# Patient Record
Sex: Female | Born: 1940 | Race: White | Hispanic: No | Marital: Single | Smoking: Never smoker
Health system: Southern US, Community
[De-identification: ages and names within clinical notes are randomized; demographics above are authoritative.]

## PROBLEM LIST (undated history)

## (undated) DIAGNOSIS — G309 Alzheimer's disease, unspecified: Secondary | ICD-10-CM

## (undated) DIAGNOSIS — F419 Anxiety disorder, unspecified: Secondary | ICD-10-CM

## (undated) DIAGNOSIS — F329 Major depressive disorder, single episode, unspecified: Secondary | ICD-10-CM

## (undated) DIAGNOSIS — F0280 Dementia in other diseases classified elsewhere without behavioral disturbance: Secondary | ICD-10-CM

## (undated) DIAGNOSIS — F32A Depression, unspecified: Secondary | ICD-10-CM

---

## 2016-02-14 ENCOUNTER — Encounter: Payer: Self-pay | Admitting: Sports Medicine

## 2016-02-14 ENCOUNTER — Ambulatory Visit (INDEPENDENT_AMBULATORY_CARE_PROVIDER_SITE_OTHER): Payer: Medicare Other | Admitting: Sports Medicine

## 2016-02-14 DIAGNOSIS — M79675 Pain in left toe(s): Secondary | ICD-10-CM

## 2016-02-14 DIAGNOSIS — I739 Peripheral vascular disease, unspecified: Secondary | ICD-10-CM

## 2016-02-14 DIAGNOSIS — M2040 Other hammer toe(s) (acquired), unspecified foot: Secondary | ICD-10-CM

## 2016-02-14 DIAGNOSIS — M79674 Pain in right toe(s): Secondary | ICD-10-CM | POA: Diagnosis not present

## 2016-02-14 DIAGNOSIS — B351 Tinea unguium: Secondary | ICD-10-CM | POA: Diagnosis not present

## 2016-02-14 DIAGNOSIS — M21619 Bunion of unspecified foot: Secondary | ICD-10-CM

## 2016-02-14 DIAGNOSIS — L84 Corns and callosities: Secondary | ICD-10-CM

## 2016-02-14 NOTE — Progress Notes (Signed)
Patient ID: Sarah GaskinsLinda Arroyo, female   DOB: Jul 28, 1941, 75 y.o.   MRN: [id number] Subjective: Sarah GaskinsLinda Arroyo is a 75 y.o. female patient seen today in office with complaint of painful callus and thickened and elongated toenails; unable to trim. Patient is assisted by facility caregiver who denies history of known Diabetes, Neuropathy, or Vascular disease. Patient has no other pedal complaints at this time.   There are no active problems to display for this patient.   No current outpatient prescriptions on file prior to visit.   No current facility-administered medications on file prior to visit.    No Known Allergies  Objective: Physical Exam  General: Well developed, nourished, no acute distress, awake, alert and oriented x 3  Vascular: Dorsalis pedis artery 1/4 bilateral, Posterior tibial artery 1/4 bilateral, skin temperature warm to warm proximal to distal bilateral lower extremities, + varicosities, no pedal hair present bilateral.  Neurological: Gross sensation present via light touch bilateral.   Dermatological: Skin is warm, dry, and supple bilateral, Nails 1-10 are tender, long, thick, and discolored with mild subungal debris, no webspace macerations present bilateral, no open lesions present bilateral, + hyperkeratotic tissue present right 2nd toe medial aspect and left 3rd toe distal tuft adjacent to nail. No signs of infection bilateral.  Musculoskeletal: Asymptomatic bunion and hammertoe boney deformities noted bilateral. Muscular strength within normal limits without pain on range of motion. No pain with calf compression bilateral.  Assessment and Plan:  Problem List Items Addressed This Visit    None    Visit Diagnoses    Dermatophytosis of nail    -  Primary    Callus of foot        Toe pain, bilateral        Bunion        Hammertoe, unspecified laterality        PVD (peripheral vascular disease) (HCC)          -Examined patient.  -Discussed treatment options for  painful callus and mycotic nails. -Mechanically debrided callus x 2 using sterile chisel blade and reduced mycotic nails with sterile nail nipper and dremel nail file without incident. -Recommend good supportive shoes and socks daily -Patient to return in 3 months for follow up evaluation or sooner if symptoms worsen.  Asencion Islamitorya Varshini Arrants, DPM

## 2016-05-07 ENCOUNTER — Emergency Department: Payer: Medicare Other

## 2016-05-07 ENCOUNTER — Encounter: Payer: Self-pay | Admitting: Emergency Medicine

## 2016-05-07 ENCOUNTER — Inpatient Hospital Stay
Admission: EM | Admit: 2016-05-07 | Discharge: 2016-05-11 | DRG: 481 | Disposition: A | Discharge status: Skilled Nursing Facility | Payer: Medicare Other | Attending: Internal Medicine | Admitting: Internal Medicine

## 2016-05-07 DIAGNOSIS — F0281 Dementia in other diseases classified elsewhere with behavioral disturbance: Secondary | ICD-10-CM | POA: Diagnosis present

## 2016-05-07 DIAGNOSIS — E876 Hypokalemia: Secondary | ICD-10-CM | POA: Diagnosis present

## 2016-05-07 DIAGNOSIS — E871 Hypo-osmolality and hyponatremia: Secondary | ICD-10-CM | POA: Diagnosis present

## 2016-05-07 DIAGNOSIS — Z66 Do not resuscitate: Secondary | ICD-10-CM | POA: Diagnosis present

## 2016-05-07 DIAGNOSIS — Z79899 Other long term (current) drug therapy: Secondary | ICD-10-CM | POA: Diagnosis not present

## 2016-05-07 DIAGNOSIS — I10 Essential (primary) hypertension: Secondary | ICD-10-CM | POA: Diagnosis present

## 2016-05-07 DIAGNOSIS — F419 Anxiety disorder, unspecified: Secondary | ICD-10-CM | POA: Diagnosis present

## 2016-05-07 DIAGNOSIS — S72001A Fracture of unspecified part of neck of right femur, initial encounter for closed fracture: Secondary | ICD-10-CM | POA: Diagnosis present

## 2016-05-07 DIAGNOSIS — S72009A Fracture of unspecified part of neck of unspecified femur, initial encounter for closed fracture: Secondary | ICD-10-CM

## 2016-05-07 DIAGNOSIS — W010XXA Fall on same level from slipping, tripping and stumbling without subsequent striking against object, initial encounter: Secondary | ICD-10-CM | POA: Diagnosis present

## 2016-05-07 DIAGNOSIS — F329 Major depressive disorder, single episode, unspecified: Secondary | ICD-10-CM | POA: Diagnosis present

## 2016-05-07 DIAGNOSIS — D72819 Decreased white blood cell count, unspecified: Secondary | ICD-10-CM | POA: Diagnosis present

## 2016-05-07 DIAGNOSIS — F05 Delirium due to known physiological condition: Secondary | ICD-10-CM | POA: Diagnosis not present

## 2016-05-07 DIAGNOSIS — Z419 Encounter for procedure for purposes other than remedying health state, unspecified: Secondary | ICD-10-CM

## 2016-05-07 DIAGNOSIS — M810 Age-related osteoporosis without current pathological fracture: Secondary | ICD-10-CM | POA: Diagnosis present

## 2016-05-07 DIAGNOSIS — G309 Alzheimer's disease, unspecified: Secondary | ICD-10-CM | POA: Diagnosis present

## 2016-05-07 HISTORY — DX: Major depressive disorder, single episode, unspecified: F32.9

## 2016-05-07 HISTORY — DX: Depression, unspecified: F32.A

## 2016-05-07 HISTORY — DX: Dementia in other diseases classified elsewhere, unspecified severity, without behavioral disturbance, psychotic disturbance, mood disturbance, and anxiety: F02.80

## 2016-05-07 HISTORY — DX: Alzheimer's disease, unspecified: G30.9

## 2016-05-07 HISTORY — DX: Anxiety disorder, unspecified: F41.9

## 2016-05-07 LAB — BASIC METABOLIC PANEL
Anion gap: 8 (ref 5–15)
BUN: 13 mg/dL (ref 6–20)
CALCIUM: 8.7 mg/dL — AB (ref 8.9–10.3)
CO2: 27 mmol/L (ref 22–32)
Chloride: 98 mmol/L — ABNORMAL LOW (ref 101–111)
Creatinine, Ser: 0.85 mg/dL (ref 0.44–1.00)
GFR calc Af Amer: >60 mL/min (ref >60)
Glucose, Bld: 105 mg/dL — ABNORMAL HIGH (ref 65–99)
POTASSIUM: 3.5 mmol/L (ref 3.5–5.1)
SODIUM: 133 mmol/L — AB (ref 135–145)

## 2016-05-07 LAB — CBC WITH DIFFERENTIAL/PLATELET
BASOS ABS: 0 10*3/uL (ref 0–0.1)
BASOS PCT: 1 %
EOS ABS: 0 10*3/uL (ref 0–0.7)
EOS PCT: 1 %
HCT: 40.8 % (ref 35.0–47.0)
Hemoglobin: 14.3 g/dL (ref 12.0–16.0)
LYMPHS PCT: 15 %
Lymphs Abs: 0.5 10*3/uL — ABNORMAL LOW (ref 1.0–3.6)
MCH: 30.2 pg (ref 26.0–34.0)
MCHC: 35.1 g/dL (ref 32.0–36.0)
MCV: 85.9 fL (ref 80.0–100.0)
Monocytes Absolute: 0.3 10*3/uL (ref 0.2–0.9)
Monocytes Relative: 10 %
Neutro Abs: 2.2 10*3/uL (ref 1.4–6.5)
Neutrophils Relative %: 73 %
PLATELETS: 183 10*3/uL (ref 150–440)
RBC: 4.75 MIL/uL (ref 3.80–5.20)
RDW: 14.6 % — AB (ref 11.5–14.5)
WBC: 3.1 10*3/uL — AB (ref 3.6–11.0)

## 2016-05-07 LAB — TYPE AND SCREEN
ABO/RH(D): A POS
ANTIBODY SCREEN: NEGATIVE

## 2016-05-07 LAB — PROTIME-INR
INR: 1.11
PROTHROMBIN TIME: 14.3 s (ref 11.4–15.2)

## 2016-05-07 LAB — ALBUMIN: Albumin: 3.7 g/dL (ref 3.5–5.0)

## 2016-05-07 LAB — CALCIUM: CALCIUM: 8.7 mg/dL — AB (ref 8.9–10.3)

## 2016-05-07 LAB — SURGICAL PCR SCREEN
MRSA, PCR: NEGATIVE
STAPHYLOCOCCUS AUREUS: POSITIVE — AB

## 2016-05-07 LAB — APTT: APTT: 30 s (ref 24–36)

## 2016-05-07 MED ORDER — MORPHINE SULFATE (PF) 2 MG/ML IV SOLN
2.0000 mg | INTRAVENOUS | Status: DC | PRN
  Administered 2016-05-08: 2 mg via INTRAVENOUS
  Filled 2016-05-07: qty 1

## 2016-05-07 MED ORDER — QUETIAPINE FUMARATE 25 MG PO TABS
50.0000 mg | ORAL_TABLET | Freq: Two times a day (BID) | ORAL | Status: DC
  Administered 2016-05-07 – 2016-05-11 (×7): 50 mg via ORAL
  Filled 2016-05-07 (×7): qty 2

## 2016-05-07 MED ORDER — DIVALPROEX SODIUM 250 MG PO DR TAB
250.0000 mg | DELAYED_RELEASE_TABLET | Freq: Every day | ORAL | Status: DC | PRN
  Administered 2016-05-08: 250 mg via ORAL
  Filled 2016-05-07: qty 1

## 2016-05-07 MED ORDER — GUAIFENESIN 100 MG/5ML PO SOLN
15.0000 mL | ORAL | Status: DC | PRN
  Filled 2016-05-07: qty 15

## 2016-05-07 MED ORDER — BISMUTH SUBSALICYLATE 262 MG/15ML PO SUSP
30.0000 mL | Freq: Three times a day (TID) | ORAL | Status: DC
  Administered 2016-05-07 – 2016-05-11 (×6): 30 mL via ORAL
  Filled 2016-05-07 (×2): qty 118

## 2016-05-07 MED ORDER — DIVALPROEX SODIUM 250 MG PO DR TAB
250.0000 mg | DELAYED_RELEASE_TABLET | Freq: Two times a day (BID) | ORAL | Status: DC
  Administered 2016-05-07 – 2016-05-09 (×4): 250 mg via ORAL
  Filled 2016-05-07 (×4): qty 1

## 2016-05-07 MED ORDER — ALUM & MAG HYDROXIDE-SIMETH 200-200-20 MG/5ML PO SUSP: 30.0000 mL | Freq: Four times a day (QID) | ORAL | Status: DC | PRN

## 2016-05-07 MED ORDER — ACETAMINOPHEN 325 MG PO TABS: 650.0000 mg | ORAL_TABLET | Freq: Four times a day (QID) | ORAL | Status: DC | PRN

## 2016-05-07 MED ORDER — SERTRALINE HCL 100 MG PO TABS
100.0000 mg | ORAL_TABLET | Freq: Every day | ORAL | Status: DC
  Administered 2016-05-09 – 2016-05-11 (×3): 100 mg via ORAL
  Filled 2016-05-07 (×3): qty 1

## 2016-05-07 MED ORDER — TRAZODONE HCL 50 MG PO TABS
50.0000 mg | ORAL_TABLET | Freq: Two times a day (BID) | ORAL | Status: DC
  Administered 2016-05-07 – 2016-05-11 (×7): 50 mg via ORAL
  Filled 2016-05-07 (×7): qty 1

## 2016-05-07 MED ORDER — BUSPIRONE HCL 5 MG PO TABS
7.5000 mg | ORAL_TABLET | Freq: Two times a day (BID) | ORAL | Status: DC
  Administered 2016-05-07 – 2016-05-11 (×7): 7.5 mg via ORAL
  Filled 2016-05-07: qty 1
  Filled 2016-05-07 (×2): qty 2
  Filled 2016-05-07: qty 1
  Filled 2016-05-07 (×5): qty 2

## 2016-05-07 MED ORDER — MORPHINE SULFATE (PF) 4 MG/ML IV SOLN
4.0000 mg | Freq: Once | INTRAVENOUS | Status: AC
  Administered 2016-05-07: 4 mg via INTRAVENOUS
  Filled 2016-05-07: qty 1

## 2016-05-07 MED ORDER — LORAZEPAM 2 MG/ML IJ SOLN
0.5000 mg | Freq: Once | INTRAMUSCULAR | Status: AC
  Administered 2016-05-07: 0.5 mg via INTRAVENOUS
  Filled 2016-05-07: qty 1

## 2016-05-07 MED ORDER — OXYCODONE HCL 5 MG PO TABS: 5.0000 mg | ORAL_TABLET | ORAL | Status: DC | PRN

## 2016-05-07 NOTE — ED Notes (Signed)
MD. Wieting at bedside  

## 2016-05-07 NOTE — ED Notes (Signed)
Changed pt and repositioned her

## 2016-05-07 NOTE — ED Triage Notes (Signed)
Pt presnets [facility] ED [facility] where pt had an unwitnessed fall at approx 1345. Pt c/o R hip pain. Per EMS +shortening and +Outward rotation. Pt grabs R hip when she is moved at this time. Pt has hx of dementia and repeatedly reaches for her IV. Per EMS pt had 50mcg of Fentanyl en route.

## 2016-05-07 NOTE — Consult Note (Signed)
ORTHOPAEDIC CONSULTATION  REQUESTING PHYSICIAN: Alford Highland, MD  Chief Complaint: Right hip pain status post fall  HPI: Sarah Arroyo is a 75 y.o. female with advanced dementia who fell today at her facility. She was using a walker and is reported to have been unsteady with her gait causing her to fall. Patient is unable to provide a history given her advanced dementia. Her brother, who is a retired Investment banker, operational with [employer], was at the bedside.    Past Medical History:  Diagnosis Date  . Alzheimer's dementia   . Anxiety   . Depression    History reviewed. No pertinent surgical history. Social History   Social History  . Marital status: Single    Spouse name: N/A  . Number of children: N/A  . Years of education: N/A   Social History Main Topics  . Smoking status: Unknown If Ever Smoked  . Smokeless tobacco: None  . Alcohol use No  . Drug use: No  . Sexual activity: Not Asked   Other Topics Concern  . None   Social History Narrative  . None   Family History  Problem Relation Age of Onset  . Family history unknown: Yes   No Known Allergies Prior to Admission medications   Medication Sig Start Date End Date Taking? Authorizing Provider  acetaminophen (TYLENOL) 325 MG tablet Take per standard prn orders (per [facility])   Yes Historical Provider, MD  alum & mag hydroxide-simeth (MAALOX/MYLANTA) 200-200-20 MG/5ML suspension Take per standard prn orders (per [facility])   Yes Historical Provider, MD  bismuth subsalicylate (PEPTO BISMOL) 262 MG/15ML suspension Take per standard prn orders (per [facility])   Yes Historical Provider, MD  busPIRone (BUSPAR) 7.5 MG tablet Take 7.5 mg by mouth 2 (two) times daily.  01/22/16  Yes Historical Provider, MD  divalproex (DEPAKOTE) 250 MG DR tablet Take 250 mg by mouth 2 (two) times daily.   Yes Historical Provider, MD  divalproex (DEPAKOTE) 250 MG DR tablet Take 250 mg by mouth daily as needed.   Yes Historical Provider, MD   guaiFENesin (ROBITUSSIN) 100 MG/5ML SOLN Take by mouth. Take per standard prn orders (per [facility])   Yes Historical Provider, MD  loperamide (IMODIUM) 2 MG capsule Take per standard prn orders (per [facility])   Yes Historical Provider, MD  magnesium hydroxide (MILK OF MAGNESIA) 400 MG/5ML suspension Take per standard prn orders (per [facility])   Yes Historical Provider, MD  Neomycin-Bacitracin-Polymyxin (TRIPLE ANTIBIOTIC) [phone] OINT Apply per standard prn orders (per [facility])   Yes Historical Provider, MD  QUEtiapine (SEROQUEL) 50 MG tablet Take 50 mg by mouth 2 (two) times daily.  01/22/16  Yes Historical Provider, MD  sertraline (ZOLOFT) 100 MG tablet Take 100 mg by mouth daily.  01/24/16  Yes Historical Provider, MD  traZODone (DESYREL) 50 MG tablet Take 50 mg by mouth 2 (two) times daily.  01/22/16  Yes Historical Provider, MD   Dg Chest Port 1 View  Result Date: 05/07/2016 CLINICAL DATA:  Larey Seat this afternoon while turning around, landed on RIGHT side on floor, RIGHT hip pain down length of RIGHT leg, preoperative evaluation, history Alzheimer's EXAM: PORTABLE CHEST 1 VIEW COMPARISON:  Portable exam 1510 hours without priors for comparison FINDINGS: Normal heart size, mediastinal contours, and pulmonary vascularity. Lungs hyperinflated but clear. No pleural effusion or pneumothorax. Diffuse osseous demineralization. IMPRESSION: Hyperinflated lungs without acute abnormalities. Electronically Signed   By: Ulyses Southward M.D.   On: 05/07/2016 15:28   Dg Hip Unilat W Or  Wo Pelvis 2-3 Views Right  Result Date: 05/07/2016 CLINICAL DATA:  Fall. EXAM: DG HIP (WITH OR WITHOUT PELVIS) 2-3V RIGHT COMPARISON:  No recent prior. FINDINGS: Prior lower lumbar fusion. Degenerative changes lumbar spine and both hips. Diffuse osteopenia. Slightly displaced right femoral neck fracture. No evidence of dislocation. IMPRESSION: Slightly displaced right femoral neck fracture. Electronically Signed   By: Maisie Fushomas  Register   On: 05/07/2016  15:28    Positive ROS: All other systems have been reviewed and were otherwise negative with the exception of those mentioned in the HPI and as above.  Physical Exam: General: Awake, no acute distress. Ptient is confused and unable to follow commands or participate in physical exam.  MUSCULOSKELETAL: Right lower extremity:  Skin intact overlying the right hip.  No erythema, ecchymosis or significant swelling. Her thigh compartments are soft and compressible. The right lower extremity demonstrates minimal external rotation and no significant shortening. She has palpable pedal pulses. Patient has spontaneous flexion extension of her toes in dorsiflexion and plantarflexion of her ankle. Sensation cannot be determined based on the patient's dementia.  Assessment: Minimally displaced right femoral neck hip fracture  Plan: I discussed the patient's fracture with her brother, Dr. Britt BologneseEd Goldman, retired orthopedic surgeon. I showed him the x-ray films today. The fracture is minimally displaced and I feel would be amenable to percutaneous screw fixation. Dr. Fraser DinPreston agreed. He is aware of the alternative which would be a right hip hemiarthroplasty.  He left the decision up to me and was in agreement with the plan for fixation.   Dr. Fraser DinPreston is aware of the risks and benefits of this operation attending performed himself during his career. He understands that there is a risk for infection, bleeding, nerve or blood vessel injury, avascular necrosis, displacement of the fracture, failure of the hardware, malunion, nonunion, persistent right hip pain and the need for further surgery including conversion to a hemi-arthroplasty. He understands the medical risks include DVT and pulmonary wasn't macro infarction, stroke, pneumonia, respiratory failure and death.  Patient has been cleared by the hospitalist service for surgery. They feel that she is at moderately high risk for surgery based on her age and comorbidities.  I reviewed the patient's laboratories and radiographic studies in preparation for this case. Consent has been signed with the patient's brother, who is her power of attorney. The patient scheduled for surgery tomorrow morning.      Sarah Arroyo, Sarah Clute, MD    05/07/2016 6:32 PM

## 2016-05-07 NOTE — ED Provider Notes (Addendum)
[facility] Emergency Department Provider Note  Level V caveat: Review of systems and history is limited by dementia      Time seen: ----------------------------------------- 3:10 PM on 05/07/2016 -----------------------------------------    I have reviewed the triage vital signs and the nursing notes.   HISTORY  Chief Complaint Hip Pain    HPI Sarah Arroyo is a 75 y.o. female who presents to the ER brought in by EMS from a family care home where she had an unwitnessed fall approximately 145. Patient is demented and forgets to use her walker. She presents being unable to bear weight particularly on her right leg. She is complaining of right hip pain. EMS reports shortening and outward rotation of the right leg. Family care home worker states she has not been sick recently, no recent changes in her medicines.   Past Medical History:  Diagnosis Date  . Alzheimer's dementia   . Anxiety   . Depression     There are no active problems to display for this patient.   No past surgical history on file.  Allergies Review of patient's allergies indicates no known allergies.  Social History Social History  Substance Use Topics  . Smoking status: Unknown If Ever Smoked  . Smokeless tobacco: Not on file  . Alcohol use No    Review of Systems Unknown, review of systems limited by dementia  ____________________________________________   PHYSICAL EXAM:  VITAL SIGNS: ED Triage Vitals  Enc Vitals Group     BP 05/07/16 1448 (!) 158/91     Pulse Rate 05/07/16 1448 72     Resp --      Temp 05/07/16 1448 97.9 F (36.6 C)     Temp Source 05/07/16 1448 Oral     SpO2 05/07/16 1448 96 %     Weight 05/07/16 1437 130 lb (59 kg)     Height 05/07/16 1437 5\' 5"  (1.651 m)     Head Circumference --      Peak Flow --      Pain Score --      Pain Loc --      Pain Edu? --      Excl. in GC? --     Constitutional: Alert, Well appearing and in no  distress. Eyes: Conjunctivae are normal. PERRL. Normal extraocular movements. ENT   Head: Normocephalic and atraumatic.   Nose: No congestion/rhinnorhea.   Mouth/Throat: Mucous membranes are moist.   Neck: No stridor. Cardiovascular: Normal rate, regular rhythm. No murmurs, rubs, or gallops.Good distal pulses. Respiratory: Normal respiratory effort without tachypnea nor retractions. Breath sounds are clear and equal bilaterally. No wheezes/rales/rhonchi. Gastrointestinal: Soft and nontender. Normal bowel sounds Musculoskeletal: Right lower extremity pain with range of motion, externally rotated and shortened. Neurologic:  Normal speech and language. No gross focal neurologic deficits are appreciated.  Skin:  Skin is warm, dry and intact. No rash noted. Psychiatric: Mood and affect are normal. Speech and behavior are normal.   EKG: Interpreted by me, sinus rhythm with rate of 75 bpm, baseline artifact from tremor, normal axis, no evidence of acute infarction  ____________________________________________  ED COURSE:  Pertinent labs & imaging results that were available during my care of the patient were reviewed by me and considered in my medical decision making (see chart for details). Clinical Course  She presents ER mechanical fall, likely hip fracture. We will assess with a sick x-rays and labs.  Procedures ____________________________________________   LABS (pertinent positives/negatives)  Labs Reviewed  BASIC  METABOLIC PANEL - Abnormal; Notable for the following:       Result Value   Sodium 133 (*)    Chloride 98 (*)    Glucose, Bld 105 (*)    Calcium 8.7 (*)    All other components within normal limits  CBC WITH DIFFERENTIAL/PLATELET - Abnormal; Notable for the following:    WBC 3.1 (*)    RDW 14.6 (*)    Lymphs Abs 0.5 (*)    All other components within normal limits  URINALYSIS COMPLETEWITH MICROSCOPIC ([facility] ONLY)    RADIOLOGY Images were viewed by  me  Chest x-ray, right hip x-ray Chest x-ray is normal, right hip x-ray reveals right femoral neck fracture ____________________________________________  FINAL ASSESSMENT AND PLAN  Right femoral neck fracture, dementia  Plan: Patient with labs and imaging as dictated above. Patient presented after mechanical fall and revealed have right femoral neck fracture. I discussed with orthopedics on-call Dr. Real ConsKrazinski who is made aware and will consult on the patient while in the hospital. Patient will be admitted by the hospitalist service.   Emily FilbertWilliams, Mikie Misner E, MD   Note: This dictation was prepared with Dragon dictation. Any transcriptional errors that result from this process are unintentional    Emily FilbertJonathan E Tyheem Boughner, MD 05/07/16 1535    Emily FilbertJonathan E Kelvin Burpee, MD 05/07/16 1556

## 2016-05-07 NOTE — ED Notes (Signed)
Staff member stated that they needed to leave.  Pts door left open w/ clear visualization of pt from nursing station

## 2016-05-07 NOTE — H&P (Signed)
[facility] [place] [facility]   PATIENT NAME: Sarah Arroyo    MR#:  [id number]  DATE OF BIRTH:  01/02/1941  DATE OF ADMISSION:  05/07/2016  PRIMARY CARE PHYSICIAN: Mortimer Fries, PA   REQUESTING/REFERRING PHYSICIAN: Dr Presley Raddle  CHIEF COMPLAINT:   Chief Complaint  Patient presents with  . Hip Pain    HISTORY OF PRESENT ILLNESS:  Sarah Arroyo  is a 75 y.o. female with a known history Of dementia. Patient is a poor historian and did have some agitation when I was talking to her but did answer some yes or no questions. Unable to tell me what happened. Patient does complain of right hip pain. Patient unable to tell me any information about her medical problems.  PAST MEDICAL HISTORY:   Past Medical History:  Diagnosis Date  . Alzheimer's dementia   . Anxiety   . Depression     PAST SURGICAL HISTORY:  History reviewed. No pertinent surgical history.  SOCIAL HISTORY:   Social History  Substance Use Topics  . Smoking status: Unknown If Ever Smoked  . Smokeless tobacco: Not on file  . Alcohol use No    FAMILY HISTORY:   Family History  Problem Relation Age of Onset  . Family history unknown: Yes    DRUG ALLERGIES:  No Known Allergies  REVIEW OF SYSTEMS:  Very limited secondary to dementia  MEDICATIONS AT HOME:   Prior to Admission medications   Medication Sig Start Date End Date Taking? Authorizing Provider  acetaminophen (TYLENOL) 325 MG tablet Take per standard prn orders (per [facility])   Yes Historical Provider, MD  alum & mag hydroxide-simeth (MAALOX/MYLANTA) 200-200-20 MG/5ML suspension Take per standard prn orders (per [facility])   Yes Historical Provider, MD  bismuth subsalicylate (PEPTO BISMOL) 262 MG/15ML suspension Take per standard prn orders (per [facility])   Yes Historical Provider, MD  busPIRone (BUSPAR) 7.5 MG tablet Take 7.5 mg by mouth 2 (two) times daily.  01/22/16  Yes Historical Provider, MD  divalproex (DEPAKOTE) 250  MG DR tablet Take 250 mg by mouth 2 (two) times daily.   Yes Historical Provider, MD  divalproex (DEPAKOTE) 250 MG DR tablet Take 250 mg by mouth daily as needed.   Yes Historical Provider, MD  guaiFENesin (ROBITUSSIN) 100 MG/5ML SOLN Take by mouth. Take per standard prn orders (per [facility])   Yes Historical Provider, MD  loperamide (IMODIUM) 2 MG capsule Take per standard prn orders (per [facility]   Yes Historical Provider, MD  magnesium hydroxide (MILK OF MAGNESIA) 400 MG/5ML suspension Take per standard prn orders (per [facility])   Yes Historical Provider, MD  Neomycin-Bacitracin-Polymyxin (TRIPLE ANTIBIOTIC) [phone] OINT Apply per standard prn orders (per [facility])   Yes Historical Provider, MD  QUEtiapine (SEROQUEL) 50 MG tablet Take 50 mg by mouth 2 (two) times daily.  01/22/16  Yes Historical Provider, MD  sertraline (ZOLOFT) 100 MG tablet Take 100 mg by mouth daily.  01/24/16  Yes Historical Provider, MD  traZODone (DESYREL) 50 MG tablet Take 50 mg by mouth 2 (two) times daily.  01/22/16  Yes Historical Provider, MD      VITAL SIGNS:  Blood pressure (!) 158/91, pulse 72, temperature 97.9 F (36.6 C), temperature source Oral, height 5\' 5"  (1.651 m), weight 59 kg (130 lb), SpO2 96 %.  PHYSICAL EXAMINATION:  GENERAL:  75 y.o.-year-old patient lying in the bed with Some agitation EYES: Pupils equal, round, reactive to light and accommodation. No scleral icterus. Extraocular muscles intact.  HEENT: Head atraumatic, normocephalic. Oropharynx and nasopharynx clear.  NECK:  Supple, no jugular venous distention. No thyroid enlargement, no tenderness.  LUNGS: Normal breath sounds bilaterally, no wheezing, rales,rhonchi or crepitation. No use of accessory muscles of respiration.  CARDIOVASCULAR: S1, S2 normal. No murmurs, rubs, or gallops.  ABDOMEN: Soft, nontender, nondistended. Bowel sounds present. No organomegaly or mass.  EXTREMITIES: No pedal edema, cyanosis, or clubbing. Right leg shortened and externally  rotated NEUROLOGIC: Cranial nerves II through XII are intact.  PSYCHIATRIC: The patient is alert and tries to answer questions.Marland Kitchen.  SKIN: No rash, lesion, or ulcer.   LABORATORY PANEL:   CBC  Recent Labs Lab 05/07/16 1440  WBC 3.1*  HGB 14.3  HCT 40.8  PLT 183   ------------------------------------------------------------------------------------------------------------------  Chemistries   Recent Labs Lab 05/07/16 1440  NA 133*  K 3.5  CL 98*  CO2 27  GLUCOSE 105*  BUN 13  CREATININE 0.85  CALCIUM 8.7*   ------------------------------------------------------------------------------------------------------------------    RADIOLOGY:  Dg Chest Port 1 View  Result Date: 05/07/2016 CLINICAL DATA:  Larey SeatFell this afternoon while turning around, landed on RIGHT side on floor, RIGHT hip pain down length of RIGHT leg, preoperative evaluation, history Alzheimer's EXAM: PORTABLE CHEST 1 VIEW COMPARISON:  Portable exam 1510 hours without priors for comparison FINDINGS: Normal heart size, mediastinal contours, and pulmonary vascularity. Lungs hyperinflated but clear. No pleural effusion or pneumothorax. Diffuse osseous demineralization. IMPRESSION: Hyperinflated lungs without acute abnormalities. Electronically Signed   By: Ulyses SouthwardMark  Boles M.D.   On: 05/07/2016 15:28   Dg Hip Unilat W Or Wo Pelvis 2-3 Views Right  Result Date: 05/07/2016 CLINICAL DATA:  Fall. EXAM: DG HIP (WITH OR WITHOUT PELVIS) 2-3V RIGHT COMPARISON:  No recent prior. FINDINGS: Prior lower lumbar fusion. Degenerative changes lumbar spine and both hips. Diffuse osteopenia. Slightly displaced right femoral neck fracture. No evidence of dislocation. IMPRESSION: Slightly displaced right femoral neck fracture. Electronically Signed   By: Maisie Fushomas  Register   On: 05/07/2016 15:28    EKG:   Normal sinus rhythm 75 bpm, right atrial enlargement, right axis deviation  IMPRESSION AND PLAN:   1. Preop consultation for right femoral  neck fracture. Patient is a moderate risk for surgery with age and dementia. No contraindications to surgery at this time. In order to walk again and to lessen pain I do advise surgery. Mortality within the first year of hip fracture is very high especially if the patient does not get up and walk again. High risk for postoperative delirium. Consent for surgery can be obtained by calling the patient's brother Britt Bolognesed Uresti at [phone]. Pain control with IV and oral medications 2. Dementia with agitation continue all the patient's psychiatric medications including Seroquel and Depakote. Patient will need a bed close to the nursing station 3. Hypokalemia replace potassium 4. Leukopenia no previous labs to compare. 5. Hyponatremia. Sodium 2 points less than normal range. Continue to monitor.   All the records are reviewed and case discussed with ED provider. Management plans discussed with the patient, family and they are in agreement.  CODE STATUS: DO NOT RESUSCITATE status confirmed with brother  TOTAL TIME TAKING CARE OF THIS PATIENT: 45 minutes.    Alford HighlandWIETING, Shaneca Orne M.D on 05/07/2016 at 4:51 PM  Between 7am to 6pm - Pager - [phone]  After 6pm call admission pager [phone]  [facility]  [phone]  CC: Primary care physician; Mortimer Friesavid Curl, PA

## 2016-05-08 ENCOUNTER — Encounter
Admission: EM | Disposition: A | Discharge status: Skilled Nursing Facility | Payer: Self-pay | Source: Home / Self Care | Attending: Internal Medicine

## 2016-05-08 ENCOUNTER — Inpatient Hospital Stay: Payer: Medicare Other

## 2016-05-08 ENCOUNTER — Encounter: Payer: Self-pay | Admitting: Anesthesiology

## 2016-05-08 ENCOUNTER — Inpatient Hospital Stay: Payer: Medicare Other | Admitting: Anesthesiology

## 2016-05-08 HISTORY — PX: HIP PINNING,CANNULATED: SHX1758

## 2016-05-08 LAB — CBC
HCT: 37.1 % (ref 35.0–47.0)
Hemoglobin: 13.2 g/dL (ref 12.0–16.0)
MCH: 30 pg (ref 26.0–34.0)
MCHC: 35.5 g/dL (ref 32.0–36.0)
MCV: 84.3 fL (ref 80.0–100.0)
Platelets: 161 10*3/uL (ref 150–440)
RBC: 4.4 MIL/uL (ref 3.80–5.20)
RDW: 14.2 % (ref 11.5–14.5)
WBC: 8.1 10*3/uL (ref 3.6–11.0)

## 2016-05-08 LAB — BASIC METABOLIC PANEL
Anion gap: 6 (ref 5–15)
BUN: 15 mg/dL (ref 6–20)
CALCIUM: 8.2 mg/dL — AB (ref 8.9–10.3)
CO2: 27 mmol/L (ref 22–32)
CREATININE: 0.73 mg/dL (ref 0.44–1.00)
Chloride: 100 mmol/L — ABNORMAL LOW (ref 101–111)
Glucose, Bld: 122 mg/dL — ABNORMAL HIGH (ref 65–99)
Potassium: 3.5 mmol/L (ref 3.5–5.1)
SODIUM: 133 mmol/L — AB (ref 135–145)

## 2016-05-08 SURGERY — FIXATION, FEMUR, NECK, PERCUTANEOUS, USING SCREW
Anesthesia: General | Site: Hip | Laterality: Right | Wound class: Clean

## 2016-05-08 MED ORDER — CEFAZOLIN SODIUM 1 G IJ SOLR
INTRAMUSCULAR | Status: DC | PRN
  Administered 2016-05-08: 1 g via INTRAMUSCULAR

## 2016-05-08 MED ORDER — OXYCODONE HCL 5 MG PO TABS: 5.0000 mg | ORAL_TABLET | Freq: Once | ORAL | Status: DC | PRN

## 2016-05-08 MED ORDER — KETAMINE HCL 50 MG/ML IJ SOLN
INTRAMUSCULAR | Status: DC | PRN
  Administered 2016-05-08 (×2): 25 mg via INTRAMUSCULAR

## 2016-05-08 MED ORDER — OXYCODONE HCL 5 MG/5ML PO SOLN: 5.0000 mg | Freq: Once | ORAL | Status: DC | PRN

## 2016-05-08 MED ORDER — POLYETHYLENE GLYCOL 3350 17 G PO PACK: 17.0000 g | PACK | Freq: Every day | ORAL | Status: DC | PRN

## 2016-05-08 MED ORDER — ONDANSETRON HCL 4 MG/2ML IJ SOLN: 4.0000 mg | Freq: Four times a day (QID) | INTRAMUSCULAR | Status: DC | PRN

## 2016-05-08 MED ORDER — ONDANSETRON HCL 4 MG/2ML IJ SOLN
INTRAMUSCULAR | Status: DC | PRN
  Administered 2016-05-08: 4 mg via INTRAVENOUS

## 2016-05-08 MED ORDER — MORPHINE SULFATE (PF) 2 MG/ML IV SOLN
2.0000 mg | INTRAVENOUS | Status: DC | PRN
  Administered 2016-05-08 – 2016-05-09 (×3): 2 mg via INTRAVENOUS
  Filled 2016-05-08 (×3): qty 1

## 2016-05-08 MED ORDER — MIDAZOLAM HCL 2 MG/2ML IJ SOLN
INTRAMUSCULAR | Status: DC | PRN
  Administered 2016-05-08 (×2): 1 mg via INTRAVENOUS

## 2016-05-08 MED ORDER — FENTANYL CITRATE (PF) 100 MCG/2ML IJ SOLN
INTRAMUSCULAR | Status: DC | PRN
  Administered 2016-05-08: 50 ug via INTRAVENOUS
  Administered 2016-05-08 (×2): 25 ug via INTRAVENOUS

## 2016-05-08 MED ORDER — MENTHOL 3 MG MT LOZG
1.0000 | LOZENGE | OROMUCOSAL | Status: DC | PRN
  Filled 2016-05-08: qty 9

## 2016-05-08 MED ORDER — BISACODYL 10 MG RE SUPP
10.0000 mg | Freq: Every day | RECTAL | Status: DC | PRN
  Administered 2016-05-11: 10 mg via RECTAL
  Filled 2016-05-08 (×2): qty 1

## 2016-05-08 MED ORDER — FENTANYL CITRATE (PF) 100 MCG/2ML IJ SOLN
INTRAMUSCULAR | Status: AC
  Filled 2016-05-08: qty 2

## 2016-05-08 MED ORDER — SODIUM CHLORIDE 0.9 % IV SOLN
75.0000 mL/h | INTRAVENOUS | Status: DC
  Administered 2016-05-08 – 2016-05-09 (×3): 75 mL/h via INTRAVENOUS

## 2016-05-08 MED ORDER — LIDOCAINE HCL (CARDIAC) 20 MG/ML IV SOLN
INTRAVENOUS | Status: DC | PRN
  Administered 2016-05-08: 80 mg via INTRAVENOUS

## 2016-05-08 MED ORDER — MAGNESIUM CITRATE PO SOLN
1.0000 | Freq: Once | ORAL | Status: DC | PRN
  Filled 2016-05-08: qty 296

## 2016-05-08 MED ORDER — ENOXAPARIN SODIUM 30 MG/0.3ML ~~LOC~~ SOLN: 30.0000 mg | SUBCUTANEOUS | Status: DC

## 2016-05-08 MED ORDER — DEXAMETHASONE SODIUM PHOSPHATE 10 MG/ML IJ SOLN
INTRAMUSCULAR | Status: DC | PRN
  Administered 2016-05-08: 10 mg via INTRAVENOUS

## 2016-05-08 MED ORDER — ALUM & MAG HYDROXIDE-SIMETH 200-200-20 MG/5ML PO SUSP: 30.0000 mL | ORAL | Status: DC | PRN

## 2016-05-08 MED ORDER — SUGAMMADEX SODIUM 200 MG/2ML IV SOLN
INTRAVENOUS | Status: DC | PRN
  Administered 2016-05-08: 118 mg via INTRAVENOUS

## 2016-05-08 MED ORDER — NEOMYCIN-POLYMYXIN B GU 40-200000 IR SOLN
Status: DC | PRN
  Administered 2016-05-08: 4 mL

## 2016-05-08 MED ORDER — ACETAMINOPHEN 650 MG RE SUPP: 650.0000 mg | Freq: Four times a day (QID) | RECTAL | Status: DC | PRN

## 2016-05-08 MED ORDER — ROCURONIUM BROMIDE 100 MG/10ML IV SOLN
INTRAVENOUS | Status: DC | PRN
  Administered 2016-05-08: 5 mg via INTRAVENOUS
  Administered 2016-05-08: 35 mg via INTRAVENOUS

## 2016-05-08 MED ORDER — BUPIVACAINE HCL (PF) 0.25 % IJ SOLN
INTRAMUSCULAR | Status: AC
  Filled 2016-05-08: qty 30

## 2016-05-08 MED ORDER — FENTANYL CITRATE (PF) 100 MCG/2ML IJ SOLN
25.0000 ug | INTRAMUSCULAR | Status: DC | PRN
  Administered 2016-05-08: 25 ug via INTRAVENOUS
  Administered 2016-05-08: 50 ug via INTRAVENOUS

## 2016-05-08 MED ORDER — ACETAMINOPHEN 325 MG PO TABS
650.0000 mg | ORAL_TABLET | Freq: Four times a day (QID) | ORAL | Status: DC | PRN
  Administered 2016-05-09: 650 mg via ORAL
  Filled 2016-05-08: qty 2

## 2016-05-08 MED ORDER — KETOROLAC TROMETHAMINE 30 MG/ML IJ SOLN
INTRAMUSCULAR | Status: DC | PRN
  Administered 2016-05-08: 30 mg via INTRAVENOUS

## 2016-05-08 MED ORDER — ENOXAPARIN SODIUM 40 MG/0.4ML ~~LOC~~ SOLN
40.0000 mg | SUBCUTANEOUS | Status: DC
  Administered 2016-05-09 – 2016-05-11 (×3): 40 mg via SUBCUTANEOUS
  Filled 2016-05-08 (×3): qty 0.4

## 2016-05-08 MED ORDER — LORAZEPAM 2 MG/ML IJ SOLN
1.0000 mg | INTRAMUSCULAR | Status: AC
  Administered 2016-05-08: 1 mg via INTRAVENOUS
  Filled 2016-05-08: qty 1

## 2016-05-08 MED ORDER — PHENOL 1.4 % MT LIQD
1.0000 | OROMUCOSAL | Status: DC | PRN
  Filled 2016-05-08: qty 177

## 2016-05-08 MED ORDER — SUCCINYLCHOLINE CHLORIDE 20 MG/ML IJ SOLN
INTRAMUSCULAR | Status: DC | PRN
  Administered 2016-05-08: 100 mg via INTRAVENOUS

## 2016-05-08 MED ORDER — PROPOFOL 10 MG/ML IV BOLUS
INTRAVENOUS | Status: DC | PRN
  Administered 2016-05-08: 100 mg via INTRAVENOUS

## 2016-05-08 MED ORDER — PHENYLEPHRINE HCL 10 MG/ML IJ SOLN
INTRAMUSCULAR | Status: DC | PRN
  Administered 2016-05-08: 100 ug via INTRAVENOUS
  Administered 2016-05-08: 50 ug via INTRAVENOUS

## 2016-05-08 MED ORDER — HYDROCODONE-ACETAMINOPHEN 5-325 MG PO TABS
1.0000 | ORAL_TABLET | Freq: Four times a day (QID) | ORAL | Status: DC | PRN
  Administered 2016-05-08: 2 via ORAL
  Administered 2016-05-10: 1 via ORAL
  Filled 2016-05-08: qty 2
  Filled 2016-05-08: qty 1

## 2016-05-08 MED ORDER — BUPIVACAINE HCL 0.25 % IJ SOLN
INTRAMUSCULAR | Status: DC | PRN
  Administered 2016-05-08: 30 mL

## 2016-05-08 MED ORDER — DOCUSATE SODIUM 100 MG PO CAPS
100.0000 mg | ORAL_CAPSULE | Freq: Two times a day (BID) | ORAL | Status: DC
  Administered 2016-05-08 – 2016-05-11 (×6): 100 mg via ORAL
  Filled 2016-05-08 (×6): qty 1

## 2016-05-08 MED ORDER — CEFAZOLIN IN D5W 1 GM/50ML IV SOLN
1.0000 g | Freq: Four times a day (QID) | INTRAVENOUS | Status: AC
  Administered 2016-05-08 (×2): 1 g via INTRAVENOUS
  Filled 2016-05-08 (×2): qty 50

## 2016-05-08 MED ORDER — LACTATED RINGERS IV SOLN
INTRAVENOUS | Status: DC | PRN
  Administered 2016-05-08 (×2): via INTRAVENOUS

## 2016-05-08 MED ORDER — ONDANSETRON HCL 4 MG PO TABS: 4.0000 mg | ORAL_TABLET | Freq: Four times a day (QID) | ORAL | Status: DC | PRN

## 2016-05-08 SURGICAL SUPPLY — 33 items
BIT DRILL CANN LRG QC 5X300 (BIT) ×3 IMPLANT
BLADE SURG SZ11 CARB STEEL (BLADE) ×3 IMPLANT
BNDG COHESIVE 6X5 TAN STRL LF (GAUZE/BANDAGES/DRESSINGS) ×3 IMPLANT
CANISTER SUCT 1200ML W/VALVE (MISCELLANEOUS) ×3 IMPLANT
CATH TRAY 16F METER LATEX (MISCELLANEOUS) ×6 IMPLANT
DRAPE SURG 17X11 SM STRL (DRAPES) ×6 IMPLANT
DRAPE U-SHAPE 47X51 STRL (DRAPES) ×3 IMPLANT
DRSG OPSITE POSTOP 3X4 (GAUZE/BANDAGES/DRESSINGS) IMPLANT
DRSG OPSITE POSTOP 4X6 (GAUZE/BANDAGES/DRESSINGS) IMPLANT
DURAPREP 26ML APPLICATOR (WOUND CARE) ×3 IMPLANT
ELECT REM PT RETURN 9FT ADLT (ELECTROSURGICAL) ×3
ELECTRODE REM PT RTRN 9FT ADLT (ELECTROSURGICAL) ×1 IMPLANT
GAUZE SPONGE 4X4 12PLY STRL (GAUZE/BANDAGES/DRESSINGS) ×3 IMPLANT
GLOVE BIOGEL PI IND STRL 9 (GLOVE) ×1 IMPLANT
GLOVE BIOGEL PI INDICATOR 9 (GLOVE) ×2
GLOVE SURG 9.0 ORTHO LTXF (GLOVE) ×6 IMPLANT
GOWN STRL REUS TWL 2XL XL LVL4 (GOWN DISPOSABLE) ×3 IMPLANT
GOWN STRL REUS W/ TWL LRG LVL3 (GOWN DISPOSABLE) ×1 IMPLANT
GOWN STRL REUS W/TWL LRG LVL3 (GOWN DISPOSABLE) ×2
GUIDEWIRE THR 2.0/319 (WIRE) ×12 IMPLANT
HOLDER FOLEY CATH W/STRAP (MISCELLANEOUS) ×3 IMPLANT
MAT BLUE FLOOR 46X72 FLO (MISCELLANEOUS) ×3 IMPLANT
NS IRRIG 1000ML POUR BTL (IV SOLUTION) ×3 IMPLANT
PACK HIP COMPR (MISCELLANEOUS) ×3 IMPLANT
SCREW CANN 6.5X105 32MM THRD (Screw) ×3 IMPLANT
SCREW CANN 6.5X80MM (Screw) ×3 IMPLANT
SCREW CANN 6.5X90 (Screw) ×6 IMPLANT
SCREW CANN 6.5X90 32MM THD (Screw) ×2 IMPLANT
STAPLER SKIN PROX 35W (STAPLE) ×3 IMPLANT
STRAP SAFETY BODY (MISCELLANEOUS) ×3 IMPLANT
SUT VIC AB 0 CT1 36 (SUTURE) ×3 IMPLANT
SUT VIC AB 2-0 CT2 27 (SUTURE) ×3 IMPLANT
SUT VICRYL 0 AB UR-6 (SUTURE) ×6 IMPLANT

## 2016-05-08 NOTE — Progress Notes (Signed)
[facility] - [place] [facility]   PATIENT NAME: Sarah GaskinsLinda Arroyo    MR#:  [id number]  DATE OF BIRTH:  01-31-1941  SUBJECTIVE:  CHIEF COMPLAINT:   Chief Complaint  Patient presents with  . Hip Pain  - Admitted after unwitnessed fall and right hip fracture. Status post surgery this morning. -Patient resting well at this time. No complaints. Still recovering from anesthesia.  REVIEW OF SYSTEMS:  Review of Systems  Unable to perform ROS: Dementia    DRUG ALLERGIES:  No Known Allergies  VITALS:  Blood pressure 132/69, pulse 82, temperature 97.8 F (36.6 C), temperature source Oral, resp. rate 18, height 5\' 5"  (1.651 m), weight 59 kg (130 lb), SpO2 95 %.  PHYSICAL EXAMINATION:  Physical Exam  GENERAL:  [age] y.o.-year-old patient lying in the bed with no acute distress.  EYES: Pupils equal, round, reactive to light and accommodation. No scleral icterus. Extraocular muscles intact.  HEENT: Head atraumatic, normocephalic. Oropharynx and nasopharynx clear. Old bruises noted on right side of the face which is healing. NECK:  Supple, no jugular venous distention. No thyroid enlargement, no tenderness.  LUNGS: Normal breath sounds bilaterally, no wheezing, rales,rhonchi or crepitation. No use of accessory muscles of respiration. Decreased bibasilar breath sounds. CARDIOVASCULAR: S1, S2 normal. No  rubs, or gallops. 3/6 systolic murmur is present ABDOMEN: Soft, nontender, nondistended. Bowel sounds present. No organomegaly or mass.  EXTREMITIES: No pedal edema, cyanosis, or clubbing. Right hip swelling around the surgical site and ice pack present NEUROLOGIC: Patient sedated from anesthesia and unable to do a complete neuro exam  PSYCHIATRIC: The patient is sedated. She is alert and oriented to self at baseline. SKIN: No obvious rash, lesion, or ulcer.    LABORATORY PANEL:   CBC  Recent Labs Lab 05/08/16 0446  WBC 8.1  HGB 13.2  HCT 37.1  PLT 161    ------------------------------------------------------------------------------------------------------------------  Chemistries   Recent Labs Lab 05/08/16 0446  NA 133*  K 3.5  CL 100*  CO2 27  GLUCOSE 122*  BUN 15  CREATININE 0.73  CALCIUM 8.2*   ------------------------------------------------------------------------------------------------------------------  Cardiac Enzymes No results for input(s): TROPONINI in the last 168 hours. ------------------------------------------------------------------------------------------------------------------  RADIOLOGY:  Dg Chest Port 1 View  Result Date: 05/07/2016 CLINICAL DATA:  Larey SeatFell this afternoon while turning around, landed on RIGHT side on floor, RIGHT hip pain down length of RIGHT leg, preoperative evaluation, history Alzheimer's EXAM: PORTABLE CHEST 1 VIEW COMPARISON:  Portable exam 1510 hours without priors for comparison FINDINGS: Normal heart size, mediastinal contours, and pulmonary vascularity. Lungs hyperinflated but clear. No pleural effusion or pneumothorax. Diffuse osseous demineralization. IMPRESSION: Hyperinflated lungs without acute abnormalities. Electronically Signed   By: Ulyses SouthwardMark  Boles M.D.   On: 05/07/2016 15:28   Dg Hip Port Unilat With Pelvis 1v Right  Result Date: 05/08/2016 CLINICAL DATA:  75 year old female with right hip fracture status post ORIF. Initial encounter. EXAM: DG HIP (WITH OR WITHOUT PELVIS) 1V PORT RIGHT COMPARISON:  Intraoperative radiographs at 0944 hours today. Preoperative films 05/07/2016. FINDINGS: Portable supine AP and cross-table lateral views of the right hip and pelvis. Four cannulated screws have been placed traversing the right femoral neck fracture. Alignment appears near anatomic. Right femoral head remains normally located. No new osseous abnormality of the right femur identified. Pelvis and visible lower lumbar spine appears stable. Postoperative changes to the right thigh soft tissues  including subcutaneous gas and skin staples. IMPRESSION: Cannulated screw placement across the right femoral neck fracture with no  adverse features. Electronically Signed   By: Odessa Fleming M.D.   On: 05/08/2016 10:54   Dg Hip Operative Unilat W Or W/o Pelvis Right  Result Date: 05/08/2016 CLINICAL DATA:  Open reduction internal fixation for fracture EXAM: OPERATIVE RIGHT HIP: 2 VIEWS FLUOROSCOPY TIME:  2 minutes 24 seconds; 3 acquired images COMPARISON:  Preoperative study May 07, 2016 FINDINGS: Frontal and lateral views were obtained. There are 4 screws transfixing a proximal femoral neck fracture with alignment essentially anatomic. The tips of the screws are in the proximal femoral head region. No dislocation. There is slight narrowing of the right hip joint. IMPRESSION: Screw fixation of proximal femoral neck fracture with alignment near anatomic. No dislocation. Tips of screws in proximal femoral head. Electronically Signed   By: Bretta Bang III M.D.   On: 05/08/2016 09:51   Dg Hip Unilat W Or Wo Pelvis 2-3 Views Right  Result Date: 05/07/2016 CLINICAL DATA:  Fall. EXAM: DG HIP (WITH OR WITHOUT PELVIS) 2-3V RIGHT COMPARISON:  No recent prior. FINDINGS: Prior lower lumbar fusion. Degenerative changes lumbar spine and both hips. Diffuse osteopenia. Slightly displaced right femoral neck fracture. No evidence of dislocation. IMPRESSION: Slightly displaced right femoral neck fracture. Electronically Signed   By: Maisie Fus  Register   On: 05/07/2016 15:28    EKG:   Orders placed or performed during the hospital encounter of 05/07/16  . ED EKG  . ED EKG  . EKG 12-Lead  . EKG 12-Lead    ASSESSMENT AND PLAN:   75 year old female with past medical history significant for dementia from [facility], anxiety and depression brought in after she had an unwitnessed fall and right hip pain.  #1 right femoral neck fracture-appreciate orthopedics consult. Patient had surgery done  today. -Monitor for postoperative delirium. -Pain medication and physical therapy when tolerated. Patient has Foley catheter present at this time  #2 depression and anxiety-continue Depakote, BuSpar, Zoloft, trazodone and Seroquel.  #3 dementia-oriented to self at baseline. Monitor for any postoperative delirium  #4 DVT prophylaxis-on Lovenox     All the records are reviewed and case discussed with Care Management/Social Workerr. Management plans discussed with the patient, family and they are in agreement.  CODE STATUS: DNR  TOTAL TIME TAKING CARE OF THIS PATIENT: 37 minutes.   POSSIBLE D/C IN 2 DAYS, DEPENDING ON CLINICAL CONDITION.   Enid Baas M.D on 05/08/2016 at 1:21 PM  Between 7am to 6pm - Pager - [phone]  After 6pm go to [email or web address] - Social research officer, government  [facility]  Office  [phone]  CC: Primary care physician; Mortimer Fries, PA

## 2016-05-08 NOTE — Progress Notes (Signed)
Pt scheduled for surgery at 0715. CHG bath done. Jewelry (bracelet) removed placed in a bag with pt's label on. Pt was NPO after midnight, surgical PCR screen done, negative for MRSA. Report given to OR RN Selena BattenKim.

## 2016-05-08 NOTE — Progress Notes (Signed)
Patient becoming agitated, pulling at tubes and trying to get out of bed postoperatively. Dr Elisabeth Pigeonvachhani called, orders placed for ativan once.

## 2016-05-08 NOTE — Progress Notes (Signed)
IV fluids 250 ml LR.

## 2016-05-08 NOTE — Anesthesia Preprocedure Evaluation (Addendum)
Anesthesia Evaluation  Patient identified by MRN, date of birth, ID band Patient awake    Reviewed: Allergy & Precautions, H&P , NPO status , Patient's Chart, lab work & pertinent test results  History of Anesthesia Complications Negative for: history of anesthetic complications  Airway Mallampati: III  TM Distance: <3 FB Neck ROM: limited    Dental  (+) Poor Dentition, Chipped, Missing   Pulmonary neg pulmonary ROS, neg shortness of breath,    Pulmonary exam normal breath sounds clear to auscultation       Cardiovascular Exercise Tolerance: Good (-) angina(-) Past MI and (-) DOE negative cardio ROS Normal cardiovascular exam Rhythm:regular Rate:Normal     Neuro/Psych PSYCHIATRIC DISORDERS negative neurological ROS     GI/Hepatic negative GI ROS, Neg liver ROS, neg GERD  ,  Endo/Other  negative endocrine ROS  Renal/GU negative Renal ROS  negative genitourinary   Musculoskeletal   Abdominal   Peds  Hematology negative hematology ROS (+)   Anesthesia Other Findings Past Medical History: No date: Alzheimer's dementia No date: Anxiety No date: Depression  History reviewed. No pertinent surgical history.  BMI    Body Mass Index:  21.63 kg/m      Reproductive/Obstetrics negative OB ROS                             Anesthesia Physical Anesthesia Plan  ASA: III  Anesthesia Plan: General ETT   Post-op Pain Management:    Induction:   Airway Management Planned:   Additional Equipment:   Intra-op Plan:   Post-operative Plan:   Informed Consent: I have reviewed the patients History and Physical, chart, labs and discussed the procedure including the risks, benefits and alternatives for the proposed anesthesia with the patient or authorized representative who has indicated his/her understanding and acceptance.   Dental Advisory Given  Plan Discussed with: Anesthesiologist,  CRNA and Surgeon  Anesthesia Plan Comments: (Phone consent from brother who is POA.  Plan for GA over spinal because patient is combative and has lumbar spinal hardware by report.  Brother agrees with plan.)       Anesthesia Quick Evaluation

## 2016-05-08 NOTE — Care Management (Signed)
CSW is actively seeking skilled nursing placement for this patient with hip fracture

## 2016-05-08 NOTE — Anesthesia Postprocedure Evaluation (Signed)
Anesthesia Post Note  Patient: Lucia GaskinsLinda Niemann  Procedure(s) Performed: Procedure(s) (LRB): CANNULATED HIP PINNING (Right)  Patient location during evaluation: PACU Anesthesia Type: General Level of consciousness: awake and alert Pain management: pain level controlled Vital Signs Assessment: post-procedure vital signs reviewed and stable Respiratory status: spontaneous breathing, nonlabored ventilation, respiratory function stable and patient connected to nasal cannula oxygen Cardiovascular status: blood pressure returned to baseline and stable Postop Assessment: no signs of nausea or vomiting Anesthetic complications: no    Last Vitals:  Vitals:   05/08/16 1145 05/08/16 1218  BP: (!) 137/107 132/69  Pulse: 83 82  Resp: 16 18  Temp: 36.7 C 36.6 C    Last Pain:  Vitals:   05/08/16 1218  TempSrc: Oral  PainSc:                  Cleda MccreedyJoseph K Christee Mervine

## 2016-05-08 NOTE — Clinical Social Work Placement (Signed)
   CLINICAL SOCIAL WORK PLACEMENT  NOTE  Date:  05/08/2016  Patient Details  Name: Lucia GaskinsLinda Engebretson MRN: [id number] Date of Birth: [birth year]  Clinical Social Work is seeking post-discharge placement for this patient at the Skilled  Nursing Facility level of care (*CSW will initial, date and re-position this form in  chart as items are completed):  Yes   Patient/family provided with [place] Clinical Social Work Department's list of facilities offering this level of care within the geographic area requested by the patient (or if unable, by the patient's family).  Yes   Patient/family informed of their freedom to choose among providers that offer the needed level of care, that participate in Medicare, Medicaid or managed care program needed by the patient, have an available bed and are willing to accept the patient.  Yes   Patient/family informed of [facility]'s ownership interest in [facility] and [facility], as well as of the fact that they are under no obligation to receive care at these facilities.  PASRR submitted to EDS on 05/08/16     PASRR number received on 05/08/16     Existing PASRR number confirmed on       FL2 transmitted to all facilities in geographic area requested by pt/family on 05/08/16     FL2 transmitted to all facilities within larger geographic area on       Patient informed that his/her managed care company has contracts with or will negotiate with certain facilities, including the following:        Yes   Patient/family informed of bed offers received.  Patient chooses bed at  ([facility] )     Physician recommends and patient chooses bed at      Patient to be transferred to   on  .  Patient to be transferred to facility by       Patient family notified on   of transfer.  Name of family member notified:        PHYSICIAN       Additional Comment:    _______________________________________________ Tamar Miano, Darleen CrockerBailey M, LCSW 05/08/2016, 2:15 PM

## 2016-05-08 NOTE — Progress Notes (Signed)
Pt is awake and confused, for surgery in the morning and removed her foley catheter that was placed last night. Writer attempted to insert foleys using fr.16 but failed. Charge RN French Anaracy aware, tried to insert but failed. Pt will have foleys once in the OR per charge RN French Anaracy.

## 2016-05-08 NOTE — Progress Notes (Signed)
ANTICOAGULATION CONSULT NOTE - Initial Consult  Pharmacy Consult for Enoxaparin  Indication: VTE prophylaxis  No Known Allergies  Patient Measurements: Height: 5\' 5"  (165.1 cm) Weight: 130 lb (59 kg) IBW/kg (Calculated) : 57 Heparin Dosing Weight:   Vital Signs: Temp: 97.9 F (36.6 C) (09/08 1323) Temp Source: Axillary (09/08 1323) BP: 115/61 (09/08 1323) Pulse Rate: 76 (09/08 1323)  Labs:  Recent Labs  05/07/16 1440 05/07/16 1816 05/08/16 0446  HGB 14.3  --  13.2  HCT 40.8  --  37.1  PLT 183  --  161  APTT  --  30  --   LABPROT  --  14.3  --   INR  --  1.11  --   CREATININE 0.85  --  0.73    Estimated Creatinine Clearance: 53 mL/min (by C-G formula based on SCr of 0.8 mg/dL).   Medical History: Past Medical History:  Diagnosis Date  . Alzheimer's dementia   . Anxiety   . Depression     Medications:  Prescriptions Prior to Admission  Medication Sig Dispense Refill Last Dose  . acetaminophen (TYLENOL) 325 MG tablet Take per standard prn orders (per MAR)   prn at prn  . alum & mag hydroxide-simeth (MAALOX/MYLANTA) 200-200-20 MG/5ML suspension Take per standard [facility] orders ([facility])   prn at prn  . bismuth subsalicylate (PEPTO BISMOL) 262 MG/15ML suspension Take per standard prn orders (per MAR)   PRN at PRN  . busPIRone (BUSPAR) 7.5 MG tablet Take 7.5 mg by mouth 2 (two) times daily.    05/07/2016 at 0800  . divalproex (DEPAKOTE) 250 MG DR tablet Take 250 mg by mouth 2 (two) times daily.   05/07/2016 at 0800  . divalproex (DEPAKOTE) 250 MG DR tablet Take 250 mg by mouth daily as needed.   prn at prn  . guaiFENesin (ROBITUSSIN) 100 MG/5ML SOLN Take by mouth. Take per standard prn orders (per MAR)   PRN at PRN  . loperamide (IMODIUM) 2 MG capsule Take per standard prn orders (per MAR)   prn at prn  . magnesium hydroxide (MILK OF MAGNESIA) 400 MG/5ML suspension Take per standard prn orders (per MAR)   prn at prn  . Neomycin-Bacitracin-Polymyxin (TRIPLE ANTIBIOTIC)  [phone] OINT Apply per standard prn orders (per MAR)   PRN at PRN  . QUEtiapine (SEROQUEL) 50 MG tablet Take 50 mg by mouth 2 (two) times daily.    05/07/2016 at 0800  . sertraline (ZOLOFT) 100 MG tablet Take 100 mg by mouth daily.    05/07/2016 at 0800  . traZODone (DESYREL) 50 MG tablet Take 50 mg by mouth 2 (two) times daily.    05/07/2016 at 0800   Scheduled:  . bismuth subsalicylate  30 mL Oral TID AC & HS  . busPIRone  7.5 mg Oral BID  .  ceFAZolin (ANCEF) IV  1 g Intravenous Q6H  . divalproex  250 mg Oral BID  . docusate sodium  100 mg Oral BID  . [START ON 05/09/2016] enoxaparin (LOVENOX) injection  40 mg Subcutaneous Q24H  . fentaNYL      . QUEtiapine  50 mg Oral BID  . sertraline  100 mg Oral Daily  . traZODone  50 mg Oral BID    Assessment: Pharmacy consulted to dose enoxaparin for VTE prophylaxis Goal of Therapy:   Monitor platelets by anticoagulation protocol: Yes   Plan:  Will start Enoxaparin 40 mg SQ daily  Ailen Strauch D 05/08/2016,1:55 PM

## 2016-05-08 NOTE — Transfer of Care (Signed)
Immediate Anesthesia Transfer of Care Note  Patient: Sarah GaskinsLinda Arroyo  Procedure(s) Performed: Procedure(s): CANNULATED HIP PINNING (Right)  Patient Location: PACU  Anesthesia Type:General  Level of Consciousness: sedated  Airway & Oxygen Therapy: Patient Spontanous Breathing and Patient connected to face mask oxygen  Post-op Assessment: Report given to RN and Post -op Vital signs reviewed and stable  Post vital signs: Reviewed and stable  Last Vitals:  Vitals:   05/08/16 0417 05/08/16 0956  BP: 138/75 (!) 144/74  Pulse: 92 65  Resp: 16 17  Temp: 37.2 C 36.4 C    Last Pain:  Vitals:   05/08/16 0417  TempSrc: Oral         Complications: No apparent anesthesia complications

## 2016-05-08 NOTE — Anesthesia Procedure Notes (Signed)
Procedure Name: Intubation Date/Time: 05/08/2016 8:12 AM Performed by: Junious SilkNOLES, Heber Hoog Pre-anesthesia Checklist: Patient identified, Patient being monitored, Timeout performed, Emergency Drugs available and Suction available Patient Re-evaluated:Patient Re-evaluated prior to inductionOxygen Delivery Method: Circle system utilized Preoxygenation: Pre-oxygenation with 100% oxygen Intubation Type: IV induction Ventilation: Mask ventilation without difficulty Laryngoscope Size: Mac and 3 Grade View: Grade I Tube type: Oral Tube size: 7.0 mm Number of attempts: 1 Airway Equipment and Method: Stylet Placement Confirmation: ETT inserted through vocal cords under direct vision,  positive ETCO2 and breath sounds checked- equal and bilateral Secured at: 21 cm Tube secured with: Tape Dental Injury: Teeth and Oropharynx as per pre-operative assessment

## 2016-05-08 NOTE — NC FL2 (Signed)
[place] MEDICAID FL2 LEVEL OF CARE SCREENING TOOL     IDENTIFICATION  Patient Name: Sarah Arroyo Birthdate: 02-27-1941 Sex: female Admission Date (Current Location): 05/07/2016  [place] and [place] Number:  Chiropodist and Address:  [facility], [address] [place], [place] [address]      Provider Number: [id number]  Attending Physician Name and Address:  Enid Baas, MD  Relative Name and Phone Number:       Current Level of Care: Hospital Recommended Level of Care: Skilled Nursing Facility Prior Approval Number:    Date Approved/Denied:   PASRR Number:  [id number])  Discharge Plan: SNF    Current Diagnoses: Patient Active Problem List   Diagnosis Date Noted  . Hip fracture (HCC) 05/07/2016   . Alzheimer's dementia   . Anxiety   . Depression      Orientation RESPIRATION BLADDER Height & Weight     Self  O2 (2 Liters Oxygen ) Continent Weight: 130 lb (59 kg) Height:  5\' 5"  (165.1 cm)  BEHAVIORAL SYMPTOMS/MOOD NEUROLOGICAL BOWEL NUTRITION STATUS   (none)  (none) Continent Diet (Regular Diet )  AMBULATORY STATUS COMMUNICATION OF NEEDS Skin   Extensive Assist Verbally Surgical wounds (Incision: Right Hip. )                       Personal Care Assistance Level of Assistance  Bathing, Feeding, Dressing Bathing Assistance: Limited assistance Feeding assistance: Independent Dressing Assistance: Limited assistance     Functional Limitations Info  Sight, Hearing, Speech Sight Info: Adequate Hearing Info: Adequate Speech Info: Adequate    SPECIAL CARE FACTORS FREQUENCY  PT (By licensed PT), OT (By licensed OT)     PT Frequency:  (5) OT Frequency:  (5)            Contractures      Additional Factors Info  Allergies   Allergies Info:  (No Known Allergies. )           Current Medications (05/08/2016):  This is the current hospital active medication list Current Facility-Administered  Medications  Medication Dose Route Frequency Provider Last Rate Last Dose  . 0.9 %  sodium chloride infusion  75 mL/hr Intravenous Continuous Juanell Fairly, MD      . acetaminophen (TYLENOL) tablet 650 mg  650 mg Oral Q6H PRN Juanell Fairly, MD       Or  . acetaminophen (TYLENOL) suppository 650 mg  650 mg Rectal Q6H PRN Juanell Fairly, MD      . alum & mag hydroxide-simeth (MAALOX/MYLANTA) 200-200-20 MG/5ML suspension 30 mL  30 mL Oral Q4H PRN Juanell Fairly, MD      . bisacodyl (DULCOLAX) suppository 10 mg  10 mg Rectal Daily PRN Juanell Fairly, MD      . bismuth subsalicylate (PEPTO BISMOL) 262 MG/15ML suspension 30 mL  30 mL Oral TID AC & HS Alford Highland, MD   30 mL at 05/07/16 2232  . busPIRone (BUSPAR) tablet 7.5 mg  7.5 mg Oral BID Alford Highland, MD   7.5 mg at 05/07/16 2300  . ceFAZolin (ANCEF) IVPB 1 g/50 mL premix  1 g Intravenous Q6H Juanell Fairly, MD      . divalproex (DEPAKOTE) DR tablet 250 mg  250 mg Oral BID Alford Highland, MD   250 mg at 05/07/16 2232  . divalproex (DEPAKOTE) DR tablet 250 mg  250 mg Oral Daily PRN Alford Highland, MD      . docusate sodium (  COLACE) capsule 100 mg  100 mg Oral BID Juanell FairlyKevin Krasinski, MD      . Melene Muller[START ON 05/09/2016] enoxaparin (LOVENOX) injection 30 mg  30 mg Subcutaneous Q24H Juanell FairlyKevin Krasinski, MD      . fentaNYL (SUBLIMAZE) 100 MCG/2ML injection           . guaiFENesin (ROBITUSSIN) 100 MG/5ML solution 300 mg  15 mL Oral Q4H PRN Alford Highlandichard Wieting, MD      . HYDROcodone-acetaminophen (NORCO/VICODIN) 5-325 MG per tablet 1-2 tablet  1-2 tablet Oral Q6H PRN Juanell FairlyKevin Krasinski, MD      . magnesium citrate solution 1 Bottle  1 Bottle Oral Once PRN Juanell FairlyKevin Krasinski, MD      . menthol-cetylpyridinium (CEPACOL) lozenge 3 mg  1 lozenge Oral PRN Juanell FairlyKevin Krasinski, MD       Or  . phenol (CHLORASEPTIC) mouth spray 1 spray  1 spray Mouth/Throat PRN Juanell FairlyKevin Krasinski, MD      . morphine 2 MG/ML injection 2 mg  2 mg Intravenous Q2H PRN Juanell FairlyKevin Krasinski, MD      .  ondansetron [facility](ZOFRAN) tablet 4 mg  4 mg Oral Q6H PRN Juanell FairlyKevin Krasinski, MD       Or  . ondansetron [facility](ZOFRAN) injection 4 mg  4 mg Intravenous Q6H PRN Juanell FairlyKevin Krasinski, MD      . polyethylene glycol (MIRALAX / GLYCOLAX) packet 17 g  17 g Oral Daily PRN Juanell FairlyKevin Krasinski, MD      . QUEtiapine (SEROQUEL) tablet 50 mg  50 mg Oral BID Alford Highlandichard Wieting, MD   50 mg at 05/07/16 2231  . sertraline (ZOLOFT) tablet 100 mg  100 mg Oral Daily Alford Highlandichard Wieting, MD      . traZODone (DESYREL) tablet 50 mg  50 mg Oral BID Alford Highlandichard Wieting, MD   50 mg at 05/07/16 2232     Discharge Medications: Please see discharge summary for a list of discharge medications.  Relevant Imaging Results:  Relevant Lab Results:   Additional Information  (SSN: [id number])  Sarah Arroyo, Darleen CrockerBailey M, LCSW

## 2016-05-08 NOTE — Progress Notes (Signed)
Subjective:  POST-OP NOTE:  Patient sleeping comfortably in bed. Her brother, Dr. Fraser Din is at the bedside. Patient appears comfortable.  Objective:   VITALS:   Vitals:   05/08/16 1125 05/08/16 1145 05/08/16 1218 05/08/16 1323  BP: (!) 151/83 (!) 137/107 132/69 115/61  Pulse: 82 83 82 76  Resp: 15 16 18 16   Temp:  98.1 F (36.7 C) 97.8 F (36.6 C) 97.9 F (36.6 C)  TempSrc:  Axillary Oral Axillary  SpO2: 100% 99% 95% 97%  Weight:      Height:        PHYSICAL EXAM:  Right lower extremity: Patient's incision is clean dry and intact. There is no drainage under honeycomb dressing. Her thigh compartments are soft and compressible. There is no erythema or ecchymosis seen. Patient's motor and sensory function cannot be tested based on the patient's dementia. She had palpable pedal pulses.   LABS  Results for orders placed or performed during the hospital encounter of 05/07/16 (from the past 24 hour(s))  Basic metabolic panel     Status: Abnormal   Collection Time: 05/07/16  2:40 PM  Result Value Ref Range   Sodium 133 (L) 135 - 145 mmol/L   Potassium 3.5 3.5 - 5.1 mmol/L   Chloride 98 (L) 101 - 111 mmol/L   CO2 27 22 - 32 mmol/L   Glucose, Bld 105 (H) 65 - 99 mg/dL   BUN 13 6 - 20 mg/dL   Creatinine, Ser 1.61 0.44 - 1.00 mg/dL   Calcium 8.7 (L) 8.9 - 10.3 mg/dL   GFR calc non Af Amer >60 >60 mL/min   GFR calc Af Amer >60 >60 mL/min   Anion gap 8 5 - 15  CBC with Differential     Status: Abnormal   Collection Time: 05/07/16  2:40 PM  Result Value Ref Range   WBC 3.1 (L) 3.6 - 11.0 K/uL   RBC 4.75 3.80 - 5.20 MIL/uL   Hemoglobin 14.3 12.0 - 16.0 g/dL   HCT 09.6 04.5 - 40.9 %   MCV 85.9 80.0 - 100.0 fL   MCH 30.2 26.0 - 34.0 pg   MCHC 35.1 32.0 - 36.0 g/dL   RDW 81.1 (H) 91.4 - 78.2 %   Platelets 183 150 - 440 K/uL   Neutrophils Relative % 73 %   Neutro Abs 2.2 1.4 - 6.5 K/uL   Lymphocytes Relative 15 %   Lymphs Abs 0.5 (L) 1.0 - 3.6 K/uL   Monocytes Relative 10 %    Monocytes Absolute 0.3 0.2 - 0.9 K/uL   Eosinophils Relative 1 %   Eosinophils Absolute 0.0 0 - 0.7 K/uL   Basophils Relative 1 %   Basophils Absolute 0.0 0 - 0.1 K/uL  Protime-INR     Status: None   Collection Time: 05/07/16  6:16 PM  Result Value Ref Range   Prothrombin Time 14.3 11.4 - 15.2 seconds   INR 1.11   APTT     Status: None   Collection Time: 05/07/16  6:16 PM  Result Value Ref Range   aPTT 30 24 - 36 seconds  Calcium     Status: Abnormal   Collection Time: 05/07/16  6:16 PM  Result Value Ref Range   Calcium 8.7 (L) 8.9 - 10.3 mg/dL  Albumin     Status: None   Collection Time: 05/07/16  6:16 PM  Result Value Ref Range   Albumin 3.7 3.5 - 5.0 g/dL  Type and screen If not already done  in ED     Status: None   Collection Time: 05/07/16  6:16 PM  Result Value Ref Range   ABO/RH(D) A POS    Antibody Screen NEG    Sample Expiration 05/10/2016   Surgical pcr screen     Status: Abnormal   Collection Time: 05/07/16  7:32 PM  Result Value Ref Range   MRSA, PCR NEGATIVE NEGATIVE   Staphylococcus aureus POSITIVE (A) NEGATIVE  CBC     Status: None   Collection Time: 05/08/16  4:46 AM  Result Value Ref Range   WBC 8.1 3.6 - 11.0 K/uL   RBC 4.40 3.80 - 5.20 MIL/uL   Hemoglobin 13.2 12.0 - 16.0 g/dL   HCT 16.137.1 09.635.0 - 04.547.0 %   MCV 84.3 80.0 - 100.0 fL   MCH 30.0 26.0 - 34.0 pg   MCHC 35.5 32.0 - 36.0 g/dL   RDW 40.914.2 81.111.5 - 91.414.5 %   Platelets 161 150 - 440 K/uL  Basic metabolic panel     Status: Abnormal   Collection Time: 05/08/16  4:46 AM  Result Value Ref Range   Sodium 133 (L) 135 - 145 mmol/L   Potassium 3.5 3.5 - 5.1 mmol/L   Chloride 100 (L) 101 - 111 mmol/L   CO2 27 22 - 32 mmol/L   Glucose, Bld 122 (H) 65 - 99 mg/dL   BUN 15 6 - 20 mg/dL   Creatinine, Ser 7.820.73 0.44 - 1.00 mg/dL   Calcium 8.2 (L) 8.9 - 10.3 mg/dL   GFR calc non Af Amer >60 >60 mL/min   GFR calc Af Amer >60 >60 mL/min   Anion gap 6 5 - 15    Dg Chest Port 1 View  Result Date:  05/07/2016 CLINICAL DATA:  Larey SeatFell this afternoon while turning around, landed on RIGHT side on floor, RIGHT hip pain down length of RIGHT leg, preoperative evaluation, history Alzheimer's EXAM: PORTABLE CHEST 1 VIEW COMPARISON:  Portable exam 1510 hours without priors for comparison FINDINGS: Normal heart size, mediastinal contours, and pulmonary vascularity. Lungs hyperinflated but clear. No pleural effusion or pneumothorax. Diffuse osseous demineralization. IMPRESSION: Hyperinflated lungs without acute abnormalities. Electronically Signed   By: Ulyses SouthwardMark  Boles M.D.   On: 05/07/2016 15:28   Dg Hip Port Unilat With Pelvis 1v Right  Result Date: 05/08/2016 CLINICAL DATA:  75 year old female with right hip fracture status post ORIF. Initial encounter. EXAM: DG HIP (WITH OR WITHOUT PELVIS) 1V PORT RIGHT COMPARISON:  Intraoperative radiographs at 0944 hours today. Preoperative films 05/07/2016. FINDINGS: Portable supine AP and cross-table lateral views of the right hip and pelvis. Four cannulated screws have been placed traversing the right femoral neck fracture. Alignment appears near anatomic. Right femoral head remains normally located. No new osseous abnormality of the right femur identified. Pelvis and visible lower lumbar spine appears stable. Postoperative changes to the right thigh soft tissues including subcutaneous gas and skin staples. IMPRESSION: Cannulated screw placement across the right femoral neck fracture with no adverse features. Electronically Signed   By: Odessa FlemingH  Hall M.D.   On: 05/08/2016 10:54   Dg Hip Operative Unilat W Or W/o Pelvis Right  Result Date: 05/08/2016 CLINICAL DATA:  Open reduction internal fixation for fracture EXAM: OPERATIVE RIGHT HIP: 2 VIEWS FLUOROSCOPY TIME:  2 minutes 24 seconds; 3 acquired images COMPARISON:  Preoperative study May 07, 2016 FINDINGS: Frontal and lateral views were obtained. There are 4 screws transfixing a proximal femoral neck fracture with alignment  essentially anatomic. The tips of  the screws are in the proximal femoral head region. No dislocation. There is slight narrowing of the right hip joint. IMPRESSION: Screw fixation of proximal femoral neck fracture with alignment near anatomic. No dislocation. Tips of screws in proximal femoral head. Electronically Signed   By: Bretta Bang III M.D.   On: 05/08/2016 09:51   Dg Hip Unilat W Or Wo Pelvis 2-3 Views Right  Result Date: 05/07/2016 CLINICAL DATA:  Fall. EXAM: DG HIP (WITH OR WITHOUT PELVIS) 2-3V RIGHT COMPARISON:  No recent prior. FINDINGS: Prior lower lumbar fusion. Degenerative changes lumbar spine and both hips. Diffuse osteopenia. Slightly displaced right femoral neck fracture. No evidence of dislocation. IMPRESSION: Slightly displaced right femoral neck fracture. Electronically Signed   By: Maisie Fus  Register   On: 05/07/2016 15:28    Assessment/Plan: Day of Surgery   Active Problems:   Hip fracture [facility])  Patient is comfortable postop. There is no acute issues postoperatively. She will complete 24 hours of postop antibiotics. Patient will be nonweightbearing on the right lower extremity for 6 weeks postop. She will be transferred from bed to chair and perform bedside physical therapy exercises. Patient's dementia is going to be a [place] to participation with therapy. Patient will be on Lovenox 30 mg daily for DVT prophylaxis.    Juanell Fairly , MD 05/08/2016, 1:25 PM

## 2016-05-08 NOTE — Progress Notes (Signed)
Subjective:  Patient with advanced dementia.  Can't provide history.  Appears comfortable.   Objective:   VITALS:   Vitals:   05/07/16 1918 05/07/16 2012 05/07/16 2325 05/08/16 0417  BP: 135/77 (!) 154/84 136/75 138/75  Pulse: 83 89 84 92  Resp:  18 16 16   Temp:  98.6 F (37 C) 98.2 F (36.8 C) 98.9 F (37.2 C)  TempSrc:  Oral Oral Oral  SpO2:  96% 97% 94%  Weight:      Height:        PHYSICAL EXAM: Right hip skin intact.  Palpable pedal pulses.   LABS  Results for orders placed or performed during the hospital encounter of 05/07/16 (from the past 24 hour(s))  Basic metabolic panel     Status: Abnormal   Collection Time: 05/07/16  2:40 PM  Result Value Ref Range   Sodium 133 (L) 135 - 145 mmol/L   Potassium 3.5 3.5 - 5.1 mmol/L   Chloride 98 (L) 101 - 111 mmol/L   CO2 27 22 - 32 mmol/L   Glucose, Bld 105 (H) 65 - 99 mg/dL   BUN 13 6 - 20 mg/dL   Creatinine, Ser 2.950.85 0.44 - 1.00 mg/dL   Calcium 8.7 (L) 8.9 - 10.3 mg/dL   GFR calc non Af Amer >60 >60 mL/min   GFR calc Af Amer >60 >60 mL/min   Anion gap 8 5 - 15  CBC with Differential     Status: Abnormal   Collection Time: 05/07/16  2:40 PM  Result Value Ref Range   WBC 3.1 (L) 3.6 - 11.0 K/uL   RBC 4.75 3.80 - 5.20 MIL/uL   Hemoglobin 14.3 12.0 - 16.0 g/dL   HCT 62.140.8 30.835.0 - 65.747.0 %   MCV 85.9 80.0 - 100.0 fL   MCH 30.2 26.0 - 34.0 pg   MCHC 35.1 32.0 - 36.0 g/dL   RDW 84.614.6 (H) 96.211.5 - 95.214.5 %   Platelets 183 150 - 440 K/uL   Neutrophils Relative % 73 %   Neutro Abs 2.2 1.4 - 6.5 K/uL   Lymphocytes Relative 15 %   Lymphs Abs 0.5 (L) 1.0 - 3.6 K/uL   Monocytes Relative 10 %   Monocytes Absolute 0.3 0.2 - 0.9 K/uL   Eosinophils Relative 1 %   Eosinophils Absolute 0.0 0 - 0.7 K/uL   Basophils Relative 1 %   Basophils Absolute 0.0 0 - 0.1 K/uL  Protime-INR     Status: None   Collection Time: 05/07/16  6:16 PM  Result Value Ref Range   Prothrombin Time 14.3 11.4 - 15.2 seconds   INR 1.11   APTT      Status: None   Collection Time: 05/07/16  6:16 PM  Result Value Ref Range   aPTT 30 24 - 36 seconds  Calcium     Status: Abnormal   Collection Time: 05/07/16  6:16 PM  Result Value Ref Range   Calcium 8.7 (L) 8.9 - 10.3 mg/dL  Albumin     Status: None   Collection Time: 05/07/16  6:16 PM  Result Value Ref Range   Albumin 3.7 3.5 - 5.0 g/dL  Type and screen If not already done in ED     Status: None   Collection Time: 05/07/16  6:16 PM  Result Value Ref Range   ABO/RH(D) A POS    Antibody Screen NEG    Sample Expiration 05/10/2016   Surgical pcr screen     Status: Abnormal  Collection Time: 05/07/16  7:32 PM  Result Value Ref Range   MRSA, PCR NEGATIVE NEGATIVE   Staphylococcus aureus POSITIVE (A) NEGATIVE  CBC     Status: None   Collection Time: 05/08/16  4:46 AM  Result Value Ref Range   WBC 8.1 3.6 - 11.0 K/uL   RBC 4.40 3.80 - 5.20 MIL/uL   Hemoglobin 13.2 12.0 - 16.0 g/dL   HCT 16.1 09.6 - 04.5 %   MCV 84.3 80.0 - 100.0 fL   MCH 30.0 26.0 - 34.0 pg   MCHC 35.5 32.0 - 36.0 g/dL   RDW 40.9 81.1 - 91.4 %   Platelets 161 150 - 440 K/uL  Basic metabolic panel     Status: Abnormal   Collection Time: 05/08/16  4:46 AM  Result Value Ref Range   Sodium 133 (L) 135 - 145 mmol/L   Potassium 3.5 3.5 - 5.1 mmol/L   Chloride 100 (L) 101 - 111 mmol/L   CO2 27 22 - 32 mmol/L   Glucose, Bld 122 (H) 65 - 99 mg/dL   BUN 15 6 - 20 mg/dL   Creatinine, Ser 7.82 0.44 - 1.00 mg/dL   Calcium 8.2 (L) 8.9 - 10.3 mg/dL   GFR calc non Af Amer >60 >60 mL/min   GFR calc Af Amer >60 >60 mL/min   Anion gap 6 5 - 15    Dg Chest Port 1 View  Result Date: 05/07/2016 CLINICAL DATA:  Larey Seat this afternoon while turning around, landed on RIGHT side on floor, RIGHT hip pain down length of RIGHT leg, preoperative evaluation, history Alzheimer's EXAM: PORTABLE CHEST 1 VIEW COMPARISON:  Portable exam 1510 hours without priors for comparison FINDINGS: Normal heart size, mediastinal contours, and pulmonary  vascularity. Lungs hyperinflated but clear. No pleural effusion or pneumothorax. Diffuse osseous demineralization. IMPRESSION: Hyperinflated lungs without acute abnormalities. Electronically Signed   By: Ulyses Southward M.D.   On: 05/07/2016 15:28   Dg Hip Unilat W Or Wo Pelvis 2-3 Views Right  Result Date: 05/07/2016 CLINICAL DATA:  Fall. EXAM: DG HIP (WITH OR WITHOUT PELVIS) 2-3V RIGHT COMPARISON:  No recent prior. FINDINGS: Prior lower lumbar fusion. Degenerative changes lumbar spine and both hips. Diffuse osteopenia. Slightly displaced right femoral neck fracture. No evidence of dislocation. IMPRESSION: Slightly displaced right femoral neck fracture. Electronically Signed   By: Maisie Fus  Register   On: 05/07/2016 15:28    Assessment/Plan: Day of Surgery   Active Problems:   Hip fracture (HCC)  OR this AM.  Right hip marked in pre-op area per hospital protocol.    Juanell Fairly , MD 05/08/2016, 7:26 AM

## 2016-05-08 NOTE — Op Note (Signed)
05/07/2016 - 05/08/2016  10:16 AM  PATIENT:  Sarah Arroyo    PRE-OPERATIVE DIAGNOSIS:  Right hip Fracture  POST-OPERATIVE DIAGNOSIS:  Same  PROCEDURE:  RIGHT CANNULATED SCREW FIXATION  SURGEON:  Juanell FairlyKRASINSKI, Makendra Vigeant, MD  ANESTHESIA:   General due to previous spinal instrumentation and local with 0.25% marcaine plain  PREOPERATIVE INDICATIONS:  Sarah Arroyo is a  75 y.o. female who fell and was found to have a minimally displaced right hip fracture for who I recommended percutaneous fixation for her fracture.   The risks benefits and alternatives were discussed with the patient preoperatively including but not limited to infection, bleeding, nerve or blood vessel injury, persistent hip pain,  malunion, nonunion, avascular necrosis, change in lower extremity rotation, leg length discrepancy, failure of the hardware and the need for revision surgery, including the potential for conversion to hemi or total hip arthroplasty. Medical risks include but are not limited to: DVT and pulmonary embolism, myocardial infarction, stroke, pneumonia, respiratory failure and death.  The patient understood and agreed with the plan for surgery.  Patient had the right hip marked with the word yes and my initials according the hospitals correct site of surgery protocol.  OPERATIVE IMPLANTS: 6.5 mm cannulated screws x 4  OPERATIVE FINDINGS: Osteoporotic bone of right hip  OPERATIVE PROCEDURE: The patient was brought to the operating room and general anesthesia was administered by the anesthesia service.  She was then placed supine on the fracture table. IV Kefzol was administered. The right lower extremity was positioned in a leg holder, without any significant reduction maneuver other than mild internal rotation.  The well leg was placed in hemi-lithotomy position and padded the leg to avoid nerve compression. The hip was prepped and draped in usual sterile fashion.  A time out was performed to verify the patient's  name, date of birth, medical record number, correct site of surgery and correct surger to be performed. The  timeout was also used to verify the patient had received antibiotics that all appropriate instruments, implants and radiographic studies were available in the room. Once all in attendance were in agreement case began..  Once the reduction was deemed near-anatomic, a small lateral incision was made in line with the femur, distal to the greater trochanter, and 4 threaded guidewires were introduced Into the lateral cortex of the femur, across the fracture site and into the humeral head in a diamond configuration. The lengths of these guidepins were measured with a depth gauge. The lateral cortex was then opened with a cannulated drill, and then the cannulated screws were advanced into position and tightened by hand. Solid fixation was achieved.  The guide pins were then removed and final C-arm images were taken of the fracture fixation. The fracture was well reduced and the hardware in good position.  The wound was irrigated copiously, and the deep and subcutaneous tissues were repaired with 0 and 2-0 Vicryl suture respectively and the skin was approximated with staples.  She was injected with 0.25% Marcaine plain at the incision site for postop pain control. A honeycomb dressing was placed over the incision. I was scrubbed and present the entire case and all sharp and instrument counts were correct at the conclusion of the case.  I spoke with the patient's brother, Dr. Britt BologneseEd Sulser, by phone from the PACU to let them know the case was completed without complication and the patient was stable in the recovery room.    Kathreen DevoidKevin L. Kyrese Gartman, MD

## 2016-05-08 NOTE — Clinical Social Work Note (Signed)
Clinical Social Work Assessment  Patient Details  Name: Sarah Arroyo MRN: [id number] Date of Birth: [birth year]  Date of referral:  05/08/16               Reason for consult:  Facility Placement                Permission sought to share information with:  Chartered certified accountant granted to share information::  Yes, Verbal Permission Granted  Name::      [place]::   [place]   Relationship::     Contact Information:     Housing/Transportation Living arrangements for the past 2 months:  [place] of Information:  Power of [place] Patient Interpreter Needed:  None Criminal Activity/Legal Involvement Pertinent to Current Situation/Hospitalization:  No - Comment as needed Significant Relationships:  Siblings Lives with:  Facility Resident Do you feel safe going back to the place where you live?    Need for family participation in patient care:  Yes (Comment)  Care giving concerns:  Patient is a long term care resident at [facility] in [place], [place].    Social Worker assessment / plan:  Holiday representative (CSW) received SNF consult. Patient had surgery today for a hip fracture. PT is pending. CSW met with patient and her brother/HPOA Ed [phone]) [phone] was at bedside. [employer] owner was also at bedside. CSW introduced self and explained role of CSW department. Patient was asleep during assessment. Per Ed he is patient's durable POA. Per Ed patient has lived at [facility] since January 2017 and is private pay. Per Butch Penny patient was independent at baseline and preformed all her ADL's without assistance. Per Butch Penny patient was working with PT at the [facility] and they were trying to introducing a walker to her. Per Butch Penny patient is not on oxygen at baseline. CSW explained that patient will likely need to go to SNF for short term rehab before returning to [facility]. Patient's brother  verbalized his understanding. CSW also explained that Medicare will pay for days 1-20 at 100% and days 21-100 at 80%. CSW explained that patient's supplemental insurance will pick up what Medicare will not cover. CSW explained patient will require a 3 night qualifying inpatient stay in order for Medicare to cover SNF. Brother verbalized his understanding and is agreeable to SNF search in [place].  CSW presented bed offers to brother and he chose [facility]. [name] liaison is aware of accepted bed offer. Patient will likely D/C Monday 05/11/16. MD and RN Case Manager is aware of above. CSW will continue to follow and assist as needed.   Employment status:  Disabled (Comment on whether or not currently receiving Disability), Retired Forensic scientist:  Medicare PT Recommendations:  Not assessed at this time Information / Referral to community resources:  [place]  Patient/Family's Response to care:  Patient's brother is agreeable for patient to go to [facility].   Patient/Family's Understanding of and Emotional Response to Diagnosis, Current Treatment, and Prognosis:  Patient's brother was very pleasant and thanked CSW for visit.   Emotional Assessment Appearance:  Appears stated age Attitude/Demeanor/Rapport:  Unable to Assess Affect (typically observed):  Unable to Assess Orientation:  Oriented to Self, Fluctuating Orientation (Suspected and/or reported Sundowners) Alcohol / Substance use:  Not Applicable Psych involvement (Current and /or in the community):  No (Comment)  Discharge Needs  Concerns to be addressed:  Discharge Planning Concerns Readmission within the last 30 days:  No Current discharge risk:  Dependent with Mobility Barriers to Discharge:  Continued Medical Work up   [employer], Veronia Beets, LCSW 05/08/2016, 2:18 PM

## 2016-05-08 NOTE — Progress Notes (Signed)
Pt  being transported to OR.

## 2016-05-09 LAB — CBC
HCT: 35.3 % (ref 35.0–47.0)
Hemoglobin: 12.4 g/dL (ref 12.0–16.0)
MCH: 30.1 pg (ref 26.0–34.0)
MCHC: 35.2 g/dL (ref 32.0–36.0)
MCV: 85.3 fL (ref 80.0–100.0)
PLATELETS: 153 10*3/uL (ref 150–440)
RBC: 4.13 MIL/uL (ref 3.80–5.20)
RDW: 14.7 % — AB (ref 11.5–14.5)
WBC: 7.1 10*3/uL (ref 3.6–11.0)

## 2016-05-09 LAB — BASIC METABOLIC PANEL
Anion gap: 7 (ref 5–15)
BUN: 15 mg/dL (ref 6–20)
CALCIUM: 7.9 mg/dL — AB (ref 8.9–10.3)
CO2: 26 mmol/L (ref 22–32)
CREATININE: 0.8 mg/dL (ref 0.44–1.00)
Chloride: 100 mmol/L — ABNORMAL LOW (ref 101–111)
GFR calc Af Amer: >60 mL/min (ref >60)
Glucose, Bld: 112 mg/dL — ABNORMAL HIGH (ref 65–99)
Potassium: 3.5 mmol/L (ref 3.5–5.1)
SODIUM: 133 mmol/L — AB (ref 135–145)

## 2016-05-09 MED ORDER — LORAZEPAM 2 MG/ML IJ SOLN
0.5000 mg | Freq: Four times a day (QID) | INTRAMUSCULAR | Status: DC | PRN
  Administered 2016-05-09 (×2): 0.5 mg via INTRAVENOUS
  Filled 2016-05-09 (×2): qty 1

## 2016-05-09 NOTE — Progress Notes (Signed)
Patient made 2 attempts OOB. Very agitated this shift.

## 2016-05-09 NOTE — Evaluation (Signed)
Occupational Therapy Evaluation Patient Details Name: Sarah Arroyo MRN: [id number] DOB: 10/25/1940 Today's Date: 05/09/2016    History of Present Illness Patient admitted to ED following unwitnessed fall at [facility], where she lives. Right hip fraxture with ORIF.   Clinical Impression   Patient was supine in bed when OT arrived. Oriented to person only. Per chart review lives in [facility] and uses a walker to ambulate. Patient is poor historian, and unable to provide information. Patient frequently closing her eyes, and requires commands to keep them open. Difficulty following commands to test BUE ROM/strenght, but BUE ROM appeared intact for tasks performed. Patient required hand over hand to initiate grooming of face washing, and frequent tactile/verbal cues to perform task. Max A overall for task. Attempted to sit edge of bed, but patient did not attempt to assist. When hands placed she would keep them there, but did not appear to assist with movements. Increased grimacing and touching of RLE during movement, but unable to state pain level. Patient could benefit from continued OT services to increase balance/transfers for increased ADL independence.    Follow Up Recommendations  SNF    Equipment Recommendations       Recommendations for Other Services PT consult     Precautions / Restrictions Precautions Precautions: Fall Restrictions Weight Bearing Restrictions: Yes RLE Weight Bearing: Non weight bearing      Mobility Bed Mobility Overal bed mobility: Needs Assistance Bed Mobility: Supine to Sit     Supine to sit: Total assist     General bed mobility comments:  (Patient had decreased participation in bed mobility. Hands placed, but did not appear to assist with task.)  Transfers                      Balance                                            ADL Overall ADL's : Needs assistance/impaired      Grooming: Wash/dry face;Maximal assistance;Cueing for sequencing;Bed level                                 General ADL Comments:  (Patient required Max A (hand over hand) to perform grooming. Unable to sit edge of bed due to lethargy/not following commands.)     Vision     Perception     Praxis      Pertinent Vitals/Pain Pain Assessment:  (Pain noted through grimace and groaning with movement. Patient unable to rate.)     Hand Dominance Right   Extremity/Trunk Assessment Upper Extremity Assessment Upper Extremity Assessment: Difficult to assess due to impaired cognition   Lower Extremity Assessment Lower Extremity Assessment: Difficult to assess due to impaired cognition       Communication     Cognition Arousal/Alertness: Lethargic   Overall Cognitive Status: No family/caregiver present to determine baseline cognitive functioning (PMH of dementia)       Memory: Decreased recall of precautions;Decreased short-term memory             General Comments       Exercises       Shoulder Instructions      Home Living Family/patient expects to be discharged to:: Other (Comment) [facility])                                        Prior Functioning/Environment          Comments:  (Patient is poor historian, unable to give PLOF details.)    OT Diagnosis: Generalized weakness;Cognitive deficits   OT Problem List: Decreased strength;Impaired balance (sitting and/or standing);Decreased safety awareness;Decreased knowledge of precautions   OT Treatment/Interventions: Self-care/ADL training;DME and/or AE instruction;Therapeutic activities;Patient/family education    OT Goals(Current goals can be found in the care plan section) Acute Rehab OT Goals OT Goal Formulation: Patient unable to participate in goal setting  OT Frequency: Min 1X/week   Barriers to D/C: Decreased caregiver support          Co-evaluation               End of Session Nurse Communication: Mobility status  Activity Tolerance: Patient limited by lethargy Patient left: in bed;with call bell/phone within reach;with bed alarm set;with SCD's reapplied   Time: [id number] OT Time Calculation (min): 15 min Charges:  OT General Charges $OT Visit: 1 Procedure OT Evaluation $OT Eval Low Complexity: 1 Procedure G-Codes:    Jeovani Weisenburger L 05/09/2016, 3:20 PM Kirstie PeriWendy Sherrine Salberg, OTR/L

## 2016-05-09 NOTE — Progress Notes (Signed)
Subjective: 1 Day Post-Op Procedure(s) (LRB): CANNULATED HIP PINNING (Right)    Patient reports pain as mild. Alert and disoriented.  No apparent discomfort. Hgb stable  Objective:   VITALS:   Vitals:   05/09/16 0503 05/09/16 0757  BP: (!) 133/56 138/62  Pulse: 86 79  Resp: 18 16  Temp:  98 F (36.7 C)    Neurologically intact Neurovascular intact Sensation intact distally Intact pulses distally Dorsiflexion/Plantar flexion intact No cellulitis present  LABS  Recent Labs  05/07/16 1440 05/08/16 0446 05/09/16 0449  HGB 14.3 13.2 12.4  HCT 40.8 37.1 35.3  WBC 3.1* 8.1 7.1  PLT 183 161 153     Recent Labs  05/07/16 1440 05/08/16 0446 05/09/16 0449  NA 133* 133* 133*  K 3.5 3.5 3.5  BUN 13 15 15   CREATININE 0.85 0.73 0.80  GLUCOSE 105* 122* 112*     Recent Labs  05/07/16 1816  INR 1.11     Assessment/Plan: 1 Day Post-Op Procedure(s) (LRB): CANNULATED HIP PINNING (Right)   Advance diet Up with therapy D/C IV fluids Discharge to SNF

## 2016-05-09 NOTE — Progress Notes (Addendum)
[employer] - [place] at [facility]   PATIENT NAME: Sarah Arroyo    MR#:  [id number]  DATE OF BIRTH:  09-Nov-1940  SUBJECTIVE:  CHIEF COMPLAINT:   Chief Complaint  Patient presents with  . Hip Pain  - - POD #1 after right hip fracture surgery, quiet now, has dementia - agitated last night.  REVIEW OF SYSTEMS:  Review of Systems  Unable to perform ROS: Dementia    DRUG ALLERGIES:  No Known Allergies  VITALS:  Blood pressure 138/62, pulse 79, temperature 98 F (36.7 C), temperature source Oral, resp. rate 16, height 5\' 5"  (1.651 m), weight 59 kg (130 lb), SpO2 97 %.  PHYSICAL EXAMINATION:  Physical Exam  GENERAL:  75 y.o.-year-old patient lying in the bed with no acute distress.  EYES: Pupils equal, round, reactive to light and accommodation. No scleral icterus. Extraocular muscles intact.  HEENT: Head atraumatic, normocephalic. Oropharynx and nasopharynx clear. Old bruises noted on right side of the face which is healing. NECK:  Supple, no jugular venous distention. No thyroid enlargement, no tenderness.  LUNGS: Normal breath sounds bilaterally, no wheezing, rales,rhonchi or crepitation. No use of accessory muscles of respiration. Decreased bibasilar breath sounds. CARDIOVASCULAR: S1, S2 normal. No  rubs, or gallops. 3/6 systolic murmur is present ABDOMEN: Soft, nontender, nondistended. Bowel sounds present. No organomegaly or mass.  EXTREMITIES: No pedal edema, cyanosis, or clubbing. Right hip swelling around the surgical site and ice pack present NEUROLOGIC: Patient resting quietly, nods head to some simple questions, not very interactive, but very agitated or combative either PSYCHIATRIC: The patient is alert and oriented to self at baseline. SKIN: No obvious rash, lesion, or ulcer.    LABORATORY PANEL:   CBC  Recent Labs Lab 05/09/16 0449  WBC 7.1  HGB 12.4  HCT 35.3  PLT 153    ------------------------------------------------------------------------------------------------------------------  Chemistries   Recent Labs Lab 05/09/16 0449  NA 133*  K 3.5  CL 100*  CO2 26  GLUCOSE 112*  BUN 15  CREATININE 0.80  CALCIUM 7.9*   ------------------------------------------------------------------------------------------------------------------  Cardiac Enzymes No results for input(s): TROPONINI in the last 168 hours. ------------------------------------------------------------------------------------------------------------------  RADIOLOGY:  Dg Chest Port 1 View  Result Date: 05/07/2016 CLINICAL DATA:  Larey Seat this afternoon while turning around, landed on RIGHT side on floor, RIGHT hip pain down length of RIGHT leg, preoperative evaluation, history Alzheimer's EXAM: PORTABLE CHEST 1 VIEW COMPARISON:  Portable exam 1510 hours without priors for comparison FINDINGS: Normal heart size, mediastinal contours, and pulmonary vascularity. Lungs hyperinflated but clear. No pleural effusion or pneumothorax. Diffuse osseous demineralization. IMPRESSION: Hyperinflated lungs without acute abnormalities. Electronically Signed   By: Ulyses Southward M.D.   On: 05/07/2016 15:28   Dg Hip Port Unilat With Pelvis 1v Right  Result Date: 05/08/2016 CLINICAL DATA:  75 year old female with right hip fracture status post ORIF. Initial encounter. EXAM: DG HIP (WITH OR WITHOUT PELVIS) 1V PORT RIGHT COMPARISON:  Intraoperative radiographs at 0944 hours today. Preoperative films 05/07/2016. FINDINGS: Portable supine AP and cross-table lateral views of the right hip and pelvis. Four cannulated screws have been placed traversing the right femoral neck fracture. Alignment appears near anatomic. Right femoral head remains normally located. No new osseous abnormality of the right femur identified. Pelvis and visible lower lumbar spine appears stable. Postoperative changes to the right thigh soft tissues  including subcutaneous gas and skin staples. IMPRESSION: Cannulated screw placement across the right femoral neck fracture with no adverse features. Electronically Signed   By:  Odessa FlemingH  Hall M.D.   On: 05/08/2016 10:54   Dg Hip Operative Unilat W Or W/o Pelvis Right  Result Date: 05/08/2016 CLINICAL DATA:  Open reduction internal fixation for fracture EXAM: OPERATIVE RIGHT HIP: 2 VIEWS FLUOROSCOPY TIME:  2 minutes 24 seconds; 3 acquired images COMPARISON:  Preoperative study May 07, 2016 FINDINGS: Frontal and lateral views were obtained. There are 4 screws transfixing a proximal femoral neck fracture with alignment essentially anatomic. The tips of the screws are in the proximal femoral head region. No dislocation. There is slight narrowing of the right hip joint. IMPRESSION: Screw fixation of proximal femoral neck fracture with alignment near anatomic. No dislocation. Tips of screws in proximal femoral head. Electronically Signed   By: Bretta BangWilliam  Woodruff III M.D.   On: 05/08/2016 09:51   Dg Hip Unilat W Or Wo Pelvis 2-3 Views Right  Result Date: 05/07/2016 CLINICAL DATA:  Fall. EXAM: DG HIP (WITH OR WITHOUT PELVIS) 2-3V RIGHT COMPARISON:  No recent prior. FINDINGS: Prior lower lumbar fusion. Degenerative changes lumbar spine and both hips. Diffuse osteopenia. Slightly displaced right femoral neck fracture. No evidence of dislocation. IMPRESSION: Slightly displaced right femoral neck fracture. Electronically Signed   By: Maisie Fushomas  Register   On: 05/07/2016 15:28    EKG:   Orders placed or performed during the hospital encounter of 05/07/16  . ED EKG  . ED EKG  . EKG 12-Lead  . EKG 12-Lead    ASSESSMENT AND PLAN:   75 year old female with past medical history significant for dementia from [facility] assisted [facility], anxiety and depression brought in after she had an unwitnessed fall and right hip pain.  #1 right femoral neck fracture-appreciate orthopedics consult. POD#1 today. Right leg non weight  bearing at this time for 6 weeks per ortho -Monitor for postoperative delirium. Ativan prn -Pain medication and physical therapy when tolerated.  - hb stable after the surgery  #2 depression and anxiety-continue Depakote, BuSpar, Zoloft, trazodone and Seroquel.  #3 dementia-oriented to self at baseline. Monitor postoperative delirium, use ativan prn  #4 DVT prophylaxis-on Lovenox     All the records are reviewed and case discussed with Care Management/Social Workerr. Management plans discussed with the patient, family and they are in agreement.  CODE STATUS: DNR  TOTAL TIME TAKING CARE OF THIS PATIENT: 37 minutes.   POSSIBLE D/C IN 2 DAYS, DEPENDING ON CLINICAL CONDITION.   Enid BaasKALISETTI,Sundiata Ferrick M.D on 05/09/2016 at 12:39 PM  Between 7am to 6pm - Pager - [phone]  After 6pm go to [email or web address] - Social research officer, governmentpassword [email or web address] [email or web address]  [facility]  Office  [phone]  CC: Primary care physician; Mortimer Friesavid Curl, PA

## 2016-05-09 NOTE — Evaluation (Signed)
Physical Therapy Evaluation Patient Details Name: Sarah GaskinsLinda Arroyo MRN: [id number] DOB: [birth year] Today's Date: 05/09/2016   History of Present Illness  75 y/o with R hip fx and subsequent ORIF.  She struggles with severe dementia and working with PT will likely be difficult.   Clinical Impression  Pt struggled to meaningfully participate with PLOF/info gathering and though she was initially pleasantly confused she was pain limited with PROM exercises and mobility and did become upset.  She was very limited with what she could do/tolerate and attempts to encourage and describe what goals we had were unfruitful.    Follow Up Recommendations SNF    Equipment Recommendations       Recommendations for Other Services       Precautions / Restrictions Precautions Precautions: Fall Restrictions Weight Bearing Restrictions: Yes RLE Weight Bearing: Non weight bearing      Mobility  Bed Mobility Overal bed mobility: Needs Assistance Bed Mobility: Supine to Sit     Supine to sit: Total assist     General bed mobility comments: Pt struggles to try getting up to sitting, very confused, resistant secondary to pain and generally shows poor awareness   Transfers                 General transfer comment: unable  Ambulation/Gait                Stairs            Wheelchair Mobility    Modified Rankin (Stroke Patients Only)       Balance                                             Pertinent Vitals/Pain Pain Assessment:  (unable to rate, indicates severe pain w/ minimal R LE mvt)    Home Living Family/patient expects to be discharged to:: Other (Comment) [facility]                 Additional Comments: group home    Prior Function           Comments: apparently she was needing a walker, pt unable to answer     Hand Dominance   Dominant Hand: Right    Extremity/Trunk Assessment   Upper Extremity  Assessment: Generalized weakness (age appropriate limitations)           Lower Extremity Assessment: Generalized weakness;Difficult to assess due to impaired cognition (grossly 4-/5 on L, R very pain limited )         Communication   Communication:  (pleasant, very confused)  Cognition Arousal/Alertness:  (awake, pleasantly confused) Behavior During Therapy: Impulsive;Anxious Overall Cognitive Status: No family/caregiver present to determine baseline cognitive functioning       Memory: Decreased short-term memory              General Comments      Exercises General Exercises - Lower Extremity Ankle Circles/Pumps: PROM;10 reps Short Arc Quad: PROM;AAROM;5 reps Hip ABduction/ADduction: PROM;10 reps      Assessment/Plan    PT Assessment Patient needs continued PT services  PT Diagnosis Generalized weakness;Difficulty walking   PT Problem List Decreased strength;Decreased range of motion;Decreased activity tolerance;Decreased mobility;Decreased balance;Decreased cognition;Decreased knowledge of use of DME;Decreased safety awareness;Pain  PT Treatment Interventions DME instruction;Gait training;Functional mobility training;Therapeutic activities;Therapeutic exercise;Balance training;Cognitive remediation   PT Goals (Current goals  can be found in the Care Plan section) Acute Rehab PT Goals Patient Stated Goal: pt unable to state PT Goal Formulation: Patient unable to participate in goal setting Time For Goal Achievement: 05/23/16 Potential to Achieve Goals: Fair    Frequency 7X/week   Barriers to discharge        Co-evaluation               End of Session   Activity Tolerance:  (limited secondary to mental status) Patient left: with bed alarm set;with call bell/phone within reach Nurse Communication: Mobility status         Time: 1610-9604 PT Time Calculation (min) (ACUTE ONLY): 10 min   Charges:   PT Evaluation $PT Eval Low Complexity: 1  Procedure     PT G CodesMalachi Arroyo, DPT 05/09/2016, 4:02 PM

## 2016-05-10 LAB — CBC
HCT: 38.1 % (ref 35.0–47.0)
HEMOGLOBIN: 13.4 g/dL (ref 12.0–16.0)
MCH: 30.4 pg (ref 26.0–34.0)
MCHC: 35.3 g/dL (ref 32.0–36.0)
MCV: 85.9 fL (ref 80.0–100.0)
PLATELETS: 170 10*3/uL (ref 150–440)
RBC: 4.43 MIL/uL (ref 3.80–5.20)
RDW: 14.7 % — ABNORMAL HIGH (ref 11.5–14.5)
WBC: 6.4 10*3/uL (ref 3.6–11.0)

## 2016-05-10 LAB — URINALYSIS COMPLETE WITH MICROSCOPIC (ARMC ONLY)
BACTERIA UA: NONE SEEN
Bilirubin Urine: NEGATIVE
GLUCOSE, UA: NEGATIVE mg/dL
Leukocytes, UA: NEGATIVE
Nitrite: NEGATIVE
PROTEIN: NEGATIVE mg/dL
SQUAMOUS EPITHELIAL / LPF: NONE SEEN
Specific Gravity, Urine: 1.01 (ref 1.005–1.030)
pH: 7 (ref 5.0–8.0)

## 2016-05-10 LAB — BASIC METABOLIC PANEL
Anion gap: 8 (ref 5–15)
BUN: 7 mg/dL (ref 6–20)
CHLORIDE: 102 mmol/L (ref 101–111)
CO2: 27 mmol/L (ref 22–32)
CREATININE: 0.54 mg/dL (ref 0.44–1.00)
Calcium: 8.3 mg/dL — ABNORMAL LOW (ref 8.9–10.3)
GFR calc Af Amer: >60 mL/min (ref >60)
GFR calc non Af Amer: >60 mL/min (ref >60)
GLUCOSE: 90 mg/dL (ref 65–99)
POTASSIUM: 3.2 mmol/L — AB (ref 3.5–5.1)
SODIUM: 137 mmol/L (ref 135–145)

## 2016-05-10 MED ORDER — POTASSIUM CHLORIDE 20 MEQ PO PACK
40.0000 meq | PACK | Freq: Once | ORAL | Status: AC
  Administered 2016-05-10: 40 meq via ORAL
  Filled 2016-05-10: qty 2

## 2016-05-10 MED ORDER — DIVALPROEX SODIUM 125 MG PO CSDR
250.0000 mg | DELAYED_RELEASE_CAPSULE | Freq: Two times a day (BID) | ORAL | Status: DC
  Administered 2016-05-10 – 2016-05-11 (×3): 250 mg via ORAL
  Filled 2016-05-10 (×3): qty 2

## 2016-05-10 NOTE — Progress Notes (Signed)
Subjective: 2 Days Post-Op Procedure(s) (LRB): CANNULATED HIP PINNING (Right)    Patient reports pain as mild. Hgb stable.  ROM of hip good.  Objective:   VITALS:   Vitals:   05/10/16 0829 05/10/16 1533  BP: (!) 179/79 (!) 183/94  Pulse: 100 86  Resp: 18 12  Temp: 98.4 F (36.9 C) 98.4 F (36.9 C)    Neurologically intact Neurovascular intact Sensation intact distally Intact pulses distally Dorsiflexion/Plantar flexion intact dressings dry  LABS  Recent Labs  05/08/16 0446 05/09/16 0449 05/10/16 0605  HGB 13.2 12.4 13.4  HCT 37.1 35.3 38.1  WBC 8.1 7.1 6.4  PLT 161 153 170     Recent Labs  05/08/16 0446 05/09/16 0449 05/10/16 0605  NA 133* 133* 137  K 3.5 3.5 3.2*  BUN 15 15 7   CREATININE 0.73 0.80 0.54  GLUCOSE 122* 112* 90    No results for input(s): LABPT, INR in the last 72 hours.   Assessment/Plan: 2 Days Post-Op Procedure(s) (LRB): CANNULATED HIP PINNING (Right)   Advance diet Up with therapy Discharge to SNF

## 2016-05-10 NOTE — Progress Notes (Signed)
[employer] -  [facility]   PATIENT NAME: Sarah GaskinsLinda Anstey    MR#:  [id number]  DATE OF BIRTH:  [birth year]  SUBJECTIVE:  CHIEF COMPLAINT:   Chief Complaint  Patient presents with  . Hip Pain  - - POD #2 after right hip fracture surgery, restless and confused in bed  REVIEW OF SYSTEMS:  Review of Systems  Unable to perform ROS: Dementia    DRUG ALLERGIES:  No Known Allergies  VITALS:  Blood pressure (!) 179/79, pulse 100, temperature 98.4 F (36.9 C), temperature source Axillary, resp. rate 18, height 5\' 5"  (1.651 m), weight 59 kg (130 lb), SpO2 100 %.  PHYSICAL EXAMINATION:  Physical Exam  GENERAL:  75 y.o.-year-old patient lying in the bed with no acute distress.  EYES: Pupils equal, round, reactive to light and accommodation. No scleral icterus. Extraocular muscles intact.  HEENT: Head atraumatic, normocephalic. Oropharynx and nasopharynx clear. Old bruises noted on right side of the face which is healing. NECK:  Supple, no jugular venous distention. No thyroid enlargement, no tenderness.  LUNGS: Normal breath sounds bilaterally, no wheezing, rales,rhonchi or crepitation. No use of accessory muscles of respiration. Decreased bibasilar breath sounds. CARDIOVASCULAR: S1, S2 normal. No  rubs, or gallops. 3/6 systolic murmur is present ABDOMEN: Soft, nontender, nondistended. Bowel sounds present. No organomegaly or mass.  EXTREMITIES: No pedal edema, cyanosis, or clubbing. Right hip swelling around the surgical site and ice pack present NEUROLOGIC: Patient resting quietly, nods head to some simple questions, not very interactive, but very agitated or combative either PSYCHIATRIC: The patient is alert and oriented to self at baseline. SKIN: No obvious rash, lesion, or ulcer.    LABORATORY PANEL:   CBC  Recent Labs Lab 05/10/16 0605  WBC 6.4  HGB 13.4  HCT 38.1  PLT 170    ------------------------------------------------------------------------------------------------------------------  Chemistries   Recent Labs Lab 05/10/16 0605  NA 137  K 3.2*  CL 102  CO2 27  GLUCOSE 90  BUN 7  CREATININE 0.54  CALCIUM 8.3*   ------------------------------------------------------------------------------------------------------------------  Cardiac Enzymes No results for input(s): TROPONINI in the last 168 hours. ------------------------------------------------------------------------------------------------------------------  RADIOLOGY:  Dg Hip Port Unilat With Pelvis 1v Right  Result Date: 05/08/2016 CLINICAL DATA:  75 year old female with right hip fracture status post ORIF. Initial encounter. EXAM: DG HIP (WITH OR WITHOUT PELVIS) 1V PORT RIGHT COMPARISON:  Intraoperative radiographs at 0944 hours today. Preoperative films 05/07/2016. FINDINGS: Portable supine AP and cross-table lateral views of the right hip and pelvis. Four cannulated screws have been placed traversing the right femoral neck fracture. Alignment appears near anatomic. Right femoral head remains normally located. No new osseous abnormality of the right femur identified. Pelvis and visible lower lumbar spine appears stable. Postoperative changes to the right thigh soft tissues including subcutaneous gas and skin staples. IMPRESSION: Cannulated screw placement across the right femoral neck fracture with no adverse features. Electronically Signed   By: Odessa FlemingH  Hall M.D.   On: 05/08/2016 10:54    EKG:   Orders placed or performed during the hospital encounter of 05/07/16  . ED EKG  . ED EKG  . EKG 12-Lead  . EKG 12-Lead    ASSESSMENT AND PLAN:   75 year old female with past medical history significant for dementia from [facility], anxiety and depression brought in after she had an unwitnessed fall and right hip pain.  #1 right femoral neck fracture-appreciate orthopedics consult  s/p pinning. POD#2 today. Right leg non weight bearing at  this time for 6 weeks per ortho -complicated with postoperative delirium. Ativan prn-check UA -Pain medication and physical therapy when tolerated.  - hb stable after the surgery  #2 depression and anxiety-continue Depakote, BuSpar, Zoloft, trazodone and Seroquel.  #3 dementia-oriented to self at baseline. postoperative delirium, use ativan prn Check UA  #4 Hypokalemia- being replaced  #5 DVT prophylaxis-on Lovenox  Appreciate physical therapy consult Discharge to rehab in 1-2 days   All the records are reviewed and case discussed with Care Management/Social Workerr. Management plans discussed with the patient, family and they are in agreement.  CODE STATUS: DNR  TOTAL TIME TAKING CARE OF THIS PATIENT: 37 minutes.   POSSIBLE D/C IN 1-2  DAYS, DEPENDING ON CLINICAL CONDITION.   Enid Baas M.D on 05/10/2016 at 10:14 AM  Between 7am to 6pm - Pager - [phone]  After 6pm go to [email or web address] - Social research officer, government  [facility]  Office  [phone]  CC: Primary care physician; Mortimer Fries, PA

## 2016-05-10 NOTE — Progress Notes (Signed)
Patient linen change x 2. Continues to try to get OOB.

## 2016-05-10 NOTE — Progress Notes (Signed)
Physical Therapy Treatment Patient Details Name: Lucia GaskinsLinda Kratky MRN: [id number] DOB: 11/20/1940 Today's Date: 05/10/2016    History of Present Illness 75 y/o with R hip fx and subsequent ORIF.  She struggles with severe dementia and working with PT will likely be difficult.     PT Comments    Pt is lethargic and confused t/o the session and largely is unable to show meaningful participation.  Most exercises were PROM only and though she occasionally showed some motion responding to pain she ultimately is very limited.   Follow Up Recommendations  SNF     Equipment Recommendations       Recommendations for Other Services       Precautions / Restrictions Precautions Precautions: Fall Restrictions RLE Weight Bearing: Non weight bearing    Mobility  Bed Mobility               General bed mobility comments: Pt very lethargic, laregly non-interactive.  Deferred attempts at sitting this AM  Transfers                    Ambulation/Gait                 Stairs            Wheelchair Mobility    Modified Rankin (Stroke Patients Only)       Balance                                    Cognition Arousal/Alertness: Lethargic Behavior During Therapy:  (confused) Overall Cognitive Status: History of cognitive impairments - at baseline                      Exercises General Exercises - Lower Extremity Ankle Circles/Pumps: PROM;10 reps Short Arc Quad: PROM;15 reps Heel Slides: PROM;10 reps Hip ABduction/ADduction: 15 reps;PROM Straight Leg Raises: PROM;10 reps    General Comments        Pertinent Vitals/Pain Pain Assessment:  (unable to rate, indicates pain with any R LE mvt)    Home Living                      Prior Function            PT Goals (current goals can now be found in the care plan section) Progress towards PT goals: Progressing toward goals    Frequency  7X/week    PT Plan Current  plan remains appropriate    Co-evaluation             End of Session   Activity Tolerance: Patient limited by lethargy Patient left: with bed alarm set;with call bell/phone within reach     Time: [id number] PT Time Calculation (min) (ACUTE ONLY): 12 min  Charges:  $Therapeutic Exercise: 8-22 mins                    G Codes:      Malachi ProGalen R Yousef Huge, DPT 05/10/2016, 12:08 PM

## 2016-05-10 NOTE — Progress Notes (Signed)
Hip dressing changed this shift. Moderate amount of drainage on old dressing.  Incision looks free from infection.

## 2016-05-11 ENCOUNTER — Encounter: Payer: Self-pay | Admitting: Orthopedic Surgery

## 2016-05-11 LAB — BASIC METABOLIC PANEL
ANION GAP: 6 (ref 5–15)
BUN: 8 mg/dL (ref 6–20)
CO2: 29 mmol/L (ref 22–32)
Calcium: 8.4 mg/dL — ABNORMAL LOW (ref 8.9–10.3)
Chloride: 101 mmol/L (ref 101–111)
Creatinine, Ser: 0.6 mg/dL (ref 0.44–1.00)
GFR calc Af Amer: >60 mL/min (ref >60)
GFR calc non Af Amer: >60 mL/min (ref >60)
GLUCOSE: 85 mg/dL (ref 65–99)
POTASSIUM: 3.3 mmol/L — AB (ref 3.5–5.1)
Sodium: 136 mmol/L (ref 135–145)

## 2016-05-11 LAB — CBC
HCT: 35.3 % (ref 35.0–47.0)
Hemoglobin: 12.8 g/dL (ref 12.0–16.0)
MCH: 30.9 pg (ref 26.0–34.0)
MCHC: 36.1 g/dL — AB (ref 32.0–36.0)
MCV: 85.5 fL (ref 80.0–100.0)
Platelets: 165 10*3/uL (ref 150–440)
RBC: 4.14 MIL/uL (ref 3.80–5.20)
RDW: 14.8 % — AB (ref 11.5–14.5)
WBC: 4.5 10*3/uL (ref 3.6–11.0)

## 2016-05-11 MED ORDER — POTASSIUM CHLORIDE 20 MEQ PO PACK
40.0000 meq | PACK | Freq: Once | ORAL | Status: AC
  Administered 2016-05-11: 40 meq via ORAL
  Filled 2016-05-11: qty 2

## 2016-05-11 MED ORDER — AMLODIPINE BESYLATE 5 MG PO TABS
5.0000 mg | ORAL_TABLET | Freq: Every day | ORAL | Status: DC
  Administered 2016-05-11: 5 mg via ORAL
  Filled 2016-05-11: qty 1

## 2016-05-11 MED ORDER — AMLODIPINE BESYLATE 5 MG PO TABS: 5.0000 mg | ORAL_TABLET | Freq: Every day | ORAL | 2 refills | Status: AC

## 2016-05-11 MED ORDER — DOCUSATE SODIUM 100 MG PO CAPS: 100.0000 mg | ORAL_CAPSULE | Freq: Two times a day (BID) | ORAL | 0 refills | Status: AC

## 2016-05-11 MED ORDER — HYDROCODONE-ACETAMINOPHEN 5-325 MG PO TABS: 1.0000 | ORAL_TABLET | Freq: Four times a day (QID) | ORAL | 0 refills | Status: AC | PRN

## 2016-05-11 MED ORDER — BISACODYL 10 MG RE SUPP
10.0000 mg | Freq: Once | RECTAL | Status: AC
  Administered 2016-05-11: 10 mg via RECTAL

## 2016-05-11 MED ORDER — ACETAMINOPHEN 325 MG PO TABS: 650.0000 mg | ORAL_TABLET | Freq: Four times a day (QID) | ORAL | 0 refills | Status: AC | PRN

## 2016-05-11 MED ORDER — MAGNESIUM HYDROXIDE 400 MG/5ML PO SUSP
30.0000 mL | Freq: Every day | ORAL | Status: DC
  Administered 2016-05-11: 30 mL via ORAL
  Filled 2016-05-11: qty 30

## 2016-05-11 MED ORDER — DIVALPROEX SODIUM 125 MG PO CSDR: 250.0000 mg | DELAYED_RELEASE_CAPSULE | Freq: Two times a day (BID) | ORAL | 2 refills | Status: AC

## 2016-05-11 MED ORDER — FLEET ENEMA 7-19 GM/118ML RE ENEM
1.0000 | ENEMA | Freq: Once | RECTAL | Status: AC
  Administered 2016-05-11: 1 via RECTAL

## 2016-05-11 MED ORDER — ENOXAPARIN SODIUM 30 MG/0.3ML ~~LOC~~ SOLN: 30.0000 mg | SUBCUTANEOUS | 0 refills | Status: AC

## 2016-05-11 MED ORDER — ENOXAPARIN SODIUM 30 MG/0.3ML ~~LOC~~ SOLN: 30.0000 mg | SUBCUTANEOUS | 0 refills | Status: DC

## 2016-05-11 MED ORDER — POLYETHYLENE GLYCOL 3350 17 G PO PACK: 17.0000 g | PACK | Freq: Every day | ORAL | 0 refills | Status: AC | PRN

## 2016-05-11 MED ORDER — LORAZEPAM 0.5 MG PO TABS: 0.5000 mg | ORAL_TABLET | Freq: Three times a day (TID) | ORAL | 0 refills | Status: AC | PRN

## 2016-05-11 NOTE — Progress Notes (Signed)
ANTICOAGULATION CONSULT NOTE - Follow up Consult  Pharmacy Consult for Enoxaparin  Indication: VTE prophylaxis  No Known Allergies  Patient Measurements: Height: 5\' 5"  (165.1 cm) Weight: 130 lb (59 kg) IBW/kg (Calculated) : 57   Vital Signs: Temp: 97.9 F (36.6 C) (09/11 0904) Temp Source: Oral (09/11 0904) BP: 177/89 (09/11 0904) Pulse Rate: 83 (09/11 0904)  Labs:  Recent Labs  05/09/16 0449 05/10/16 0605 05/11/16 0402  HGB 12.4 13.4 12.8  HCT 35.3 38.1 35.3  PLT 153 170 165  CREATININE 0.80 0.54 0.60    Estimated Creatinine Clearance: 53 mL/min (by C-G formula based on SCr of 0.8 mg/dL).   Medical History: Past Medical History:  Diagnosis Date  . Alzheimer's dementia   . Anxiety   . Depression     Assessment: Pharmacy consulted to dose enoxaparin for VTE prophylaxis s/p hip surgery on 9/8  Goal of Therapy:  Monitor platelets by anticoagulation protocol: Yes   Plan:  Will continue enoxaparin 40 mg SQ daily Hgb and plt about stable   Crist FatWang, Kristain Hu L 05/11/2016,10:36 AM

## 2016-05-11 NOTE — Discharge Summary (Addendum)
[facility] - [place] at [facility]   PATIENT NAME: Sarah GaskinsLinda Sikora    MR#:  [id number]  DATE OF BIRTH:  September 06, 1940  DATE OF ADMISSION:  05/07/2016   ADMITTING PHYSICIAN: Juanell FairlyKevin Krasinski, MD  DATE OF DISCHARGE: 05/11/2016  PRIMARY CARE PHYSICIAN: Mortimer Friesavid Curl, PA   ADMISSION DIAGNOSIS:   Displaced fracture of right femoral neck, closed, initial encounter (HCC) [S72.001A]  DISCHARGE DIAGNOSIS:   Active Problems:   Hip fracture (HCC)   SECONDARY DIAGNOSIS:   Past Medical History:  Diagnosis Date  . Alzheimer's dementia   . Anxiety   . Depression     HOSPITAL COURSE:   75 year old female with past medical history significant for dementia from [facility], anxiety and depression brought in after she had an unwitnessed fall and right hip pain.  #1 right femoral neck fracture-appreciate orthopedics consult s/p pinning. POD#3 today. Right leg non weight bearing at this time for 6 weeks per ortho, just continue bed to chair and bedside physical therapy exercises -complicated with postoperative delirium. Improved now. Very calm and cooperate now. Urine analysis negative for any infection. Continue Ativan as needed -Pain medication and physical therapy.  - hb stable after the surgery -Will be discharged to rehabilitation today - Lovenox for DVT Prophylaxis for 4 weeks due to non weight bearing status  #2 depression and anxiety-continue Depakote, BuSpar, Zoloft, trazodone and Seroquel.  #3 dementia-oriented to self at baseline. postoperative delirium, use ativan prn  #4 Hypokalemia- being replaced  #5 hypertension-also accelerated secondary to hip pain. Norvasc low-dose added at this time.  Physical therapy recommended rehabilitation and patient will be discharged to rehabilitation. Her POA, brother Mr. Fraser Dinreston has been updated at bedside.   DISCHARGE CONDITIONS:   Guarded CONSULTS OBTAINED:   Treatment Team:  Juanell FairlyKevin Krasinski,  MD  DRUG ALLERGIES:   No Known Allergies DISCHARGE MEDICATIONS:     Medication List    STOP taking these medications   divalproex 250 MG DR tablet Commonly known as:  DEPAKOTE Replaced by:  divalproex 125 MG capsule You also have another medication with the same name that you need to continue taking as instructed.   loperamide 2 MG capsule Commonly known as:  IMODIUM     TAKE these medications   acetaminophen 325 MG tablet Commonly known as:  TYLENOL Take 2 tablets (650 mg total) by mouth every 6 (six) hours as needed for mild pain, fever or headache (or Fever >/= 101). What changed:  how much to take  how to take this  when to take this  reasons to take this  additional instructions   alum & mag hydroxide-simeth 200-200-20 MG/5ML suspension Commonly known as:  MAALOX/MYLANTA Take per standard prn orders (per MAR)   amLODipine 5 MG tablet Commonly known as:  NORVASC Take 1 tablet (5 mg total) by mouth daily.   bismuth subsalicylate 262 MG/15ML suspension Commonly known as:  PEPTO BISMOL Take per standard prn orders (per MAR)   busPIRone 7.5 MG tablet Commonly known as:  BUSPAR Take 7.5 mg by mouth 2 (two) times daily.   divalproex 250 MG DR tablet Commonly known as:  DEPAKOTE Take 250 mg by mouth daily as needed. What changed:  Another medication with the same name was added. Make sure you understand how and when to take each.  Another medication with the same name was removed. Continue taking this medication, and follow the directions you see here.   divalproex 125 MG capsule Commonly known as:  DEPAKOTE SPRINKLE Take 2 capsules (250 mg total) by mouth 2 (two) times daily. What changed:  You were already taking a medication with the same name, and this prescription was added. Make sure you understand how and when to take each. Replaces:  divalproex 250 MG DR tablet   docusate sodium 100 MG capsule Commonly known as:  COLACE Take 1 capsule (100 mg  total) by mouth 2 (two) times daily.   enoxaparin 30 MG/0.3ML injection Commonly known as:  LOVENOX Inject 0.3 mLs (30 mg total) into the skin daily. X 28 days   guaiFENesin 100 MG/5ML Soln Commonly known as:  ROBITUSSIN Take by mouth. Take per standard prn orders (per [facility])   HYDROcodone-acetaminophen 5-325 MG tablet Commonly known as:  NORCO/VICODIN Take 1 tablet by mouth every 6 (six) hours as needed for moderate pain.   LORazepam 0.5 MG tablet Commonly known as:  ATIVAN Take 1 tablet (0.5 mg total) by mouth every 8 (eight) hours as needed for anxiety (agitation).   magnesium hydroxide 400 MG/5ML suspension Commonly known as:  MILK OF MAGNESIA Take per standard prn orders (per MAR)   polyethylene glycol packet Commonly known as:  MIRALAX / GLYCOLAX Take 17 g by mouth daily as needed for mild constipation.   QUEtiapine 50 MG tablet Commonly known as:  SEROQUEL Take 50 mg by mouth 2 (two) times daily.   sertraline 100 MG tablet Commonly known as:  ZOLOFT Take 100 mg by mouth daily.   traZODone 50 MG tablet Commonly known as:  DESYREL Take 50 mg by mouth 2 (two) times daily.   TRIPLE ANTIBIOTIC [id number] Oint Apply per standard prn orders (per MAR)        DISCHARGE INSTRUCTIONS:   1. Nonweightbearing of the right leg for 6 weeks. Bed to chair assist and also bedside physical therapy exercises. 2. Orthopedics follow-up in 1-2 weeks 3. PCP follow-up in 2 weeks.  DIET:   Cardiac diet  ACTIVITY:   Activity as tolerated  OXYGEN:   Home Oxygen: No.  Oxygen Delivery: room air  DISCHARGE LOCATION:   nursing home   If you experience worsening of your admission symptoms, develop shortness of breath, life threatening emergency, suicidal or homicidal thoughts you must seek medical attention immediately by calling 911 or calling your MD immediately  if symptoms less severe.  You Must read complete instructions/literature along with all the possible adverse  reactions/side effects for all the Medicines you take and that have been prescribed to you. Take any new Medicines after you have completely understood and accpet all the possible adverse reactions/side effects.   Please note  You were cared for by a hospitalist during your hospital stay. If you have any questions about your discharge medications or the care you received while you were in the hospital after you are discharged, you can call the unit and asked to speak with the hospitalist on call if the hospitalist that took care of you is not available. Once you are discharged, your primary care physician will handle any further medical issues. Please note that NO REFILLS for any discharge medications will be authorized once you are discharged, as it is imperative that you return to your primary care physician (or establish a relationship with a primary care physician if you do not have one) for your aftercare needs so that they can reassess your need for medications and monitor your lab values.    On the day of Discharge:  VITAL SIGNS:   Blood pressure Marland Kitchen)  177/89, pulse 83, temperature 97.9 F (36.6 C), temperature source Oral, resp. rate 18, height 5\' 5"  (1.651 m), weight 59 kg (130 lb), SpO2 100 %.  PHYSICAL EXAMINATION:    GENERAL:  75 y.o.-year-old patient lying in the bed with no acute distress. Very calm and cooperative today EYES: Pupils equal, round, reactive to light and accommodation. No scleral icterus. Extraocular muscles intact.  HEENT: Head atraumatic, normocephalic. Oropharynx and nasopharynx clear. Old bruises noted on right side of the face which is healing. NECK:  Supple, no jugular venous distention. No thyroid enlargement, no tenderness.  LUNGS: Normal breath sounds bilaterally, no wheezing, rales,rhonchi or crepitation. No use of accessory muscles of respiration. Decreased bibasilar breath sounds. CARDIOVASCULAR: S1, S2 normal. No  rubs, or gallops. 3/6 systolic murmur is  present ABDOMEN: Soft, nontender, nondistended. Bowel sounds present. No organomegaly or mass.  EXTREMITIES: No pedal edema, cyanosis, or clubbing. Right hip swelling around the surgical site and dressing in place.  NEUROLOGIC: Patient resting quietly, calm and cooperative, follows only some simple commands, not very interactive, not combative or agitated. PSYCHIATRIC: The patient is alert and oriented to self at baseline. SKIN: No obvious rash, lesion, or ulcer.   DATA REVIEW:   CBC  Recent Labs Lab 05/11/16 0402  WBC 4.5  HGB 12.8  HCT 35.3  PLT 165    Chemistries   Recent Labs Lab 05/11/16 0402  NA 136  K 3.3*  CL 101  CO2 29  GLUCOSE 85  BUN 8  CREATININE 0.60  CALCIUM 8.4*     Microbiology Results  Results for orders placed or performed during the hospital encounter of 05/07/16  Surgical pcr screen     Status: Abnormal   Collection Time: 05/07/16  7:32 PM  Result Value Ref Range Status   MRSA, PCR NEGATIVE NEGATIVE Final   Staphylococcus aureus POSITIVE (A) NEGATIVE Final    Comment:        The Xpert SA Assay (FDA approved for NASAL specimens in patients over 50 years of age), is one component of a comprehensive surveillance program.  Test performance has been validated by [facility] for patients greater than or equal to 29 year old. It is not intended to diagnose infection nor to guide or monitor treatment.     RADIOLOGY:  No results found.   Management plans discussed with the patient, family and they are in agreement.  CODE STATUS:     Code Status Orders        Start     Ordered   05/08/16 1327  Do not attempt resuscitation (DNR)  Continuous    Question Answer Comment  In the event of cardiac or respiratory ARREST Do not call a "code blue"   In the event of cardiac or respiratory ARREST Do not perform Intubation, CPR, defibrillation or ACLS   In the event of cardiac or respiratory ARREST Use medication by any route, position, wound  care, and other measures to relive pain and suffering. May use oxygen, suction and manual treatment of airway obstruction as needed for comfort.      05/08/16 1326    Code Status History    Date Active Date Inactive Code Status Order ID Comments User Context   05/07/2016  4:59 PM 05/08/2016 11:49 AM DNR [id number]  Alford Highland, MD ED   05/07/2016  3:45 PM 05/07/2016  4:59 PM Full Code [id number]  Juanell Fairly, MD ED    Advance Directive Documentation   Flowsheet Row Most Recent  Value  Type of Advance Directive  Healthcare Power of Attorney, Living will  Pre-existing out of facility DNR order (yellow form or pink MOST form)  No data  "MOST" Form in Place?  No data      TOTAL TIME TAKING CARE OF THIS PATIENT: 38 minutes.    Enid Baas M.D on 05/11/2016 at 11:16 AM  Between 7am to 6pm - Pager - [phone]  After 6pm go to [email or web address] - Social research officer, government  [facility] [place] [facility]  Office  [phone]  CC: Primary care physician; Mortimer Fries, PA   Note: This dictation was prepared with Dragon dictation along with smaller phrase technology. Any transcriptional errors that result from this process are unintentional.

## 2016-05-11 NOTE — Care Management Important Message (Signed)
Important Message  Patient Details  Name: Lucia GaskinsLinda Gayman MRN: [id number] Date of Birth: 04-03-1941   Medicare Important Message Given:  Yes    Marily MemosLisa M Thaddus Mcdowell, RN 05/11/2016, 8:57 AM

## 2016-05-11 NOTE — Progress Notes (Signed)
Patient BP 177/89, was elevated last night and yesterday. MD notified and ordered Norvasc. Patient also does not have any documentation of a bowel movement. MD notified and Milk of mag and suppository ordered.   Harvie HeckMelanie Grayson Pfefferle, RN

## 2016-05-11 NOTE — [place] FL2 (Signed)
[place] MEDICAID FL2 LEVEL OF CARE SCREENING TOOL     IDENTIFICATION  Patient Name: Sarah Arroyo Birthdate: 01/04/1941 Sex: female Admission Date (Current Location): 05/07/2016  [place] and [place] Number:  Chiropodist and Address:  [facility], [address], [place], [place] [address]      Provider Number: [id number]  Attending Physician Name and Address:  Enid Baas, MD  Relative Name and Phone Number:       Current Level of Care: Hospital Recommended Level of Care: Skilled Nursing Facility Prior Approval Number:    Date Approved/Denied:   PASRR Number:  [id number])  Discharge Plan: SNF    Current Diagnoses: Patient Active Problem List   Diagnosis Date Noted  . Hip fracture (HCC) 05/07/2016    Orientation RESPIRATION BLADDER Height & Weight     Self  O2 (2 Liters Oxygen ) Continent Weight: 130 lb (59 kg) Height:  5\' 5"  (165.1 cm)  BEHAVIORAL SYMPTOMS/MOOD NEUROLOGICAL BOWEL NUTRITION STATUS   (none)  (none) Continent Diet (Regular Diet )  AMBULATORY STATUS COMMUNICATION OF NEEDS Skin   Extensive Assist Verbally Surgical wounds (Incision: Right Hip. )                       Personal Care Assistance Level of Assistance  Bathing, Feeding, Dressing Bathing Assistance: Limited assistance Feeding assistance: Independent Dressing Assistance: Limited assistance     Functional Limitations Info  Sight, Hearing, Speech Sight Info: Adequate Hearing Info: Adequate Speech Info: Adequate    SPECIAL CARE FACTORS FREQUENCY  PT (By licensed PT), OT (By licensed OT)     PT Frequency:  (5) OT Frequency:  (5)            Contractures      Additional Factors Info  Allergies   Allergies Info:  (No Known Allergies. )           Current Medications (05/11/2016):  This is the current hospital active medication list Current Facility-Administered Medications  Medication Dose Route Frequency Provider Last Rate  Last Dose  . 0.9 %  sodium chloride infusion  75 mL/hr Intravenous Continuous Juanell Fairly, MD 75 mL/hr at 05/09/16 1654 75 mL/hr at 05/09/16 1654  . acetaminophen (TYLENOL) tablet 650 mg  650 mg Oral Q6H PRN Juanell Fairly, MD   650 mg at 05/09/16 1809   Or  . acetaminophen (TYLENOL) suppository 650 mg  650 mg Rectal Q6H PRN Juanell Fairly, MD      . alum & mag hydroxide-simeth (MAALOX/MYLANTA) 200-200-20 MG/5ML suspension 30 mL  30 mL Oral Q4H PRN Juanell Fairly, MD      . amLODipine (NORVASC) tablet 5 mg  5 mg Oral Daily Enid Baas, MD   5 mg at 05/11/16 1106  . bisacodyl (DULCOLAX) suppository 10 mg  10 mg Rectal Daily PRN Juanell Fairly, MD      . bismuth subsalicylate (PEPTO BISMOL) 262 MG/15ML suspension 30 mL  30 mL Oral TID AC & HS Alford Highland, MD   30 mL at 05/10/16 0847  . busPIRone (BUSPAR) tablet 7.5 mg  7.5 mg Oral BID Alford Highland, MD   7.5 mg at 05/11/16 0901  . divalproex (DEPAKOTE SPRINKLE) capsule 250 mg  250 mg Oral BID Enid Baas, MD   250 mg at 05/11/16 0902  . divalproex (DEPAKOTE) DR tablet 250 mg  250 mg Oral Daily PRN Alford Highland, MD   250 mg at 05/08/16 1642  . docusate sodium (COLACE) capsule  100 mg  100 mg Oral BID Juanell FairlyKevin Krasinski, MD   100 mg at 05/11/16 0902  . enoxaparin (LOVENOX) injection 40 mg  40 mg Subcutaneous Q24H Juanell FairlyKevin Krasinski, MD   40 mg at 05/11/16 0900  . guaiFENesin (ROBITUSSIN) 100 MG/5ML solution 300 mg  15 mL Oral Q4H PRN Alford Highlandichard Wieting, MD      . HYDROcodone-acetaminophen (NORCO/VICODIN) 5-325 MG per tablet 1-2 tablet  1-2 tablet Oral Q6H PRN Juanell FairlyKevin Krasinski, MD   1 tablet at 05/10/16 1658  . LORazepam (ATIVAN) injection 0.5 mg  0.5 mg Intravenous Q6H PRN Enid Baasadhika Kalisetti, MD   0.5 mg at 05/09/16 1814  . magnesium citrate solution 1 Bottle  1 Bottle Oral Once PRN Juanell FairlyKevin Krasinski, MD      . magnesium hydroxide (MILK OF MAGNESIA) suspension 30 mL  30 mL Oral Daily Enid Baasadhika Kalisetti, MD   30 mL at 05/11/16 1106  .  menthol-cetylpyridinium (CEPACOL) lozenge 3 mg  1 lozenge Oral PRN Juanell FairlyKevin Krasinski, MD       Or  . phenol [facility](CHLORASEPTIC) mouth spray 1 spray  1 spray Mouth/Throat PRN Juanell FairlyKevin Krasinski, MD      . morphine 2 MG/ML injection 2 mg  2 mg Intravenous Q2H PRN Juanell FairlyKevin Krasinski, MD   2 mg at 05/09/16 0022  . ondansetron (ZOFRAN) tablet 4 mg  4 mg Oral Q6H PRN Juanell FairlyKevin Krasinski, MD       Or  . ondansetron [facility](ZOFRAN) injection 4 mg  4 mg Intravenous Q6H PRN Juanell FairlyKevin Krasinski, MD      . polyethylene glycol (MIRALAX / GLYCOLAX) packet 17 g  17 g Oral Daily PRN Juanell FairlyKevin Krasinski, MD      . potassium chloride (KLOR-CON) packet 40 mEq  40 mEq Oral Once Enid Baasadhika Kalisetti, MD      . QUEtiapine (SEROQUEL) tablet 50 mg  50 mg Oral BID Alford Highlandichard Wieting, MD   50 mg at 05/11/16 0901  . sertraline (ZOLOFT) tablet 100 mg  100 mg Oral Daily Alford Highlandichard Wieting, MD   100 mg at 05/11/16 0901  . traZODone (DESYREL) tablet 50 mg  50 mg Oral BID Alford Highlandichard Wieting, MD   50 mg at 05/11/16 0901     Discharge Medications: Please see discharge summary for a list of discharge medications.  Relevant Imaging Results:  Relevant Lab Results:   Additional Information  (SSN: [id number])  Nadra Hritz, Darleen CrockerBailey M, LCSW

## 2016-05-11 NOTE — Clinical Social Work Placement (Signed)
   CLINICAL SOCIAL WORK PLACEMENT  NOTE  Date:  05/11/2016  Patient Details  Name: Sarah GaskinsLinda Arroyo MRN: [id number] Date of Birth: [birth year]  Clinical Social Work is seeking post-discharge placement for this patient at the Skilled  Nursing Facility level of care (*CSW will initial, date and re-position this form in  chart as items are completed):  Yes   Patient/family provided with [place] Clinical Social Work Department's list of facilities offering this level of care within the geographic area requested by the patient (or if unable, by the patient's family).  Yes   Patient/family informed of their freedom to choose among providers that offer the needed level of care, that participate in Medicare, Medicaid or managed care program needed by the patient, have an available bed and are willing to accept the patient.  Yes   Patient/family informed of [facility]'s ownership interest in [facility] and [facility], as well as of the fact that they are under no obligation to receive care at these facilities.  PASRR submitted to EDS on 05/08/16     PASRR number received on 05/08/16     Existing PASRR number confirmed on       FL2 transmitted to all facilities in geographic area requested by pt/family on 05/08/16     FL2 transmitted to all facilities within larger geographic area on       Patient informed that his/her managed care company has contracts with or will negotiate with certain facilities, including the following:        Yes   Patient/family informed of bed offers received.  Patient chooses bed at  ([facility] )     Physician recommends and patient chooses bed at      Patient to be transferred to  ([facility] ) on 05/11/16.  Patient to be transferred to facility by  [facility] )     Patient family notified on 05/11/16 of transfer.  Name of family member notified:   (Patient's brother [name] is aware of D/C today. )     PHYSICIAN       Additional Comment:     _______________________________________________ Anwitha Mapes, Darleen CrockerBailey M, LCSW 05/11/2016, 12:30 PM

## 2016-05-11 NOTE — Progress Notes (Signed)
MD Nemiah CommanderKalisetti gave order for once dulcolax and once fleets enema

## 2016-05-11 NOTE — Progress Notes (Signed)
Physical Therapy Treatment Patient Details Name: Sarah GaskinsLinda Arroyo MRN: [id number] DOB: 08-27-1941 Today's Date: 05/11/2016    History of Present Illness 75 y/o with R hip fx and subsequent ORIF.  She struggles with severe dementia and working with PT will likely be difficult.     PT Comments    Pt is lethargic and confused limiting participation with PT at this time. Pt acknowledges all questions however unable to find/respond with the appropriate answer. Pt unable to follow most commands requiring max cuing to engage in eating/swallowing (1 attempt) and correction in sitting posture. Pt is + 2 total assist to transfer supine to sit with HOB elevated and anxious with this transfer giving facial expression of pain. Pt able to support self sitting EOB with full L trunk lean onto L UE forearm for support min guard and +1 mod assist to correct into midline trunk alignment in sitting. Pt largely unsuccessful with following verbal and tactile cues to weight shift onto the R hip (anticipated due to pain); attempting once to reach for therapists hand using the L UE ending with "I don't think I can". Continue to recommend SNF pending progress with participation in therapy.  Follow Up Recommendations  SNF (Pending progress)     Equipment Recommendations       Recommendations for Other Services       Precautions / Restrictions Precautions Precautions: Fall Restrictions Weight Bearing Restrictions: Yes RLE Weight Bearing: Non weight bearing (R LE)    Mobility  Bed Mobility Overal bed mobility: Needs Assistance Bed Mobility: Supine to Sit;Sit to Supine     Supine to sit: Total assist;+2 for physical assistance Sit to supine: Total assist;+2 for physical assistance   General bed mobility comments: Pt requires max cuing throughout session to attempt weight shift onto R hip in semi-supine position. Pt attempted 1-2x to correct lean in this position unsuccessfully. In sitting EOB, pt requires +1  mod assist to maintain midline. PT attempted several verbal ("reach of my hand"; "try to take this sock") and tactile ("push your shoulder into my hand") cues to initiate self correction unsuccessfully with pt initiating an attempt x1 to reach with L UE.  Transfers  Not appropriate at this time.                   Ambulation/Gait  Not appropriate at this time.                Stairs            Wheelchair Mobility    Modified Rankin (Stroke Patients Only)       Balance Overall balance assessment: Needs assistance Sitting-balance support: Single extremity supported Sitting balance-Leahy Scale: Poor Sitting balance - Comments: Pt able to sit min guard with L trunk lean and L UE forearm support on the bed. +1 mod assist and L UE support to maintain trunk midline. Postural control: Left lateral lean (Pt hesitant/resistant to shift weight unto R hip in sitting)                          Cognition Arousal/Alertness: Lethargic Behavior During Therapy: Anxious (Confused) Overall Cognitive Status: No family present to determine any changes to prior cognitive status.        Memory: Decreased short-term memory (Pt unable to recall correct birthdate or location)              Exercises  AROM ankle pumps and shoulder flex attempted without  success/participation from pt. Exercises deferred this session to progress functional mobility such as bed mobility/sitting balance.     General Comments  Pt agreeable to PT session. Pt reports feeling cold throughout session. Pts thermostat adjusted accordingly and blankets applied during and after PT session.       Pertinent Vitals/Pain Pain Assessment:  (Unable to rate, moderate painful facial expression with position changes; at rest no pain like behavior or facial expressions noted); HR WFL during session    Home Living                      Prior Function            PT Goals (current goals can now be  found in the care plan section) Acute Rehab PT Goals Patient Stated Goal: To warm up Progress towards PT goals: Progressing toward goals (Limited due to confusion and lethargy)    Frequency  7X/week    PT Plan Current plan remains appropriate    Co-evaluation             End of Session   Activity Tolerance: Patient limited by lethargy;Patient limited by pain Patient left: in bed;with call bell/phone within reach;with bed alarm set;with SCD's reapplied (foot pumps on and activated; ice not applied upon PT arrival and not placed at end of session due to no reports of pain at rest and pt feeling cold; blankets applied and heat adjusted in room)     Time: 1610-9604 PT Time Calculation (min) (ACUTE ONLY): 25 min  Charges:                       G CodesAlbertina Senegal, SPT 05/11/2016, 10:56 AM

## 2016-05-11 NOTE — Progress Notes (Signed)
Subjective:  POD #3  S/p percutaneous fixation for right femoral neck hip fracture.  Patient sleeping comfortably.  She has advanced dementia and is unable to participate exam at baseline. Therefore I did not repeat the patient for exam.  Objective:   VITALS:   Vitals:   05/10/16 1947 05/11/16 0326 05/11/16 0341 05/11/16 0904  BP: (!) 166/77 (!) 173/88 (!) 168/80 (!) 177/89  Pulse: 88 (!) 215 100 83  Resp: 16 20  18   Temp: 98.4 F (36.9 C) 97.9 F (36.6 C)  97.9 F (36.6 C)  TempSrc: Axillary Oral  Oral  SpO2: 100% 95%  100%  Weight:      Height:        PHYSICAL EXAM:  Right hip: Patient's dressing has scant sero-sanguinous drainage. There is no active drainage from the incision. Patient has resolving ecchymosis. Her thigh compartments are soft and compressible. There is mild swelling in the right thigh. This fascia palpable pedal pulses. She has TED stockings and foot pumps in place.   LABS  Results for orders placed or performed during the hospital encounter of 05/07/16 (from the past 24 hour(s))  Urinalysis complete, with microscopic ([facility] only)     Status: Abnormal   Collection Time: 05/10/16  4:30 PM  Result Value Ref Range   Color, Urine YELLOW (A) YELLOW   APPearance CLEAR (A) CLEAR   Glucose, UA NEGATIVE NEGATIVE mg/dL   Bilirubin Urine NEGATIVE NEGATIVE   Ketones, ur 1+ (A) NEGATIVE mg/dL   Specific Gravity, Urine 1.010 1.005 - 1.030   Hgb urine dipstick 2+ (A) NEGATIVE   pH 7.0 5.0 - 8.0   Protein, ur NEGATIVE NEGATIVE mg/dL   Nitrite NEGATIVE NEGATIVE   Leukocytes, UA NEGATIVE NEGATIVE   RBC / HPF 6-30 0 - 5 RBC/hpf   WBC, UA 0-5 0 - 5 WBC/hpf   Bacteria, UA NONE SEEN NONE SEEN   Squamous Epithelial / LPF NONE SEEN NONE SEEN  CBC     Status: Abnormal   Collection Time: 05/11/16  4:02 AM  Result Value Ref Range   WBC 4.5 3.6 - 11.0 K/uL   RBC 4.14 3.80 - 5.20 MIL/uL   Hemoglobin 12.8 12.0 - 16.0 g/dL   HCT 16.135.3 09.635.0 - 04.547.0 %   MCV 85.5 80.0 - 100.0  fL   MCH 30.9 26.0 - 34.0 pg   MCHC 36.1 (H) 32.0 - 36.0 g/dL   RDW 40.914.8 (H) 81.111.5 - 91.414.5 %   Platelets 165 150 - 440 K/uL  Basic metabolic panel     Status: Abnormal   Collection Time: 05/11/16  4:02 AM  Result Value Ref Range   Sodium 136 135 - 145 mmol/L   Potassium 3.3 (L) 3.5 - 5.1 mmol/L   Chloride 101 101 - 111 mmol/L   CO2 29 22 - 32 mmol/L   Glucose, Bld 85 65 - 99 mg/dL   BUN 8 6 - 20 mg/dL   Creatinine, Ser 7.820.60 0.44 - 1.00 mg/dL   Calcium 8.4 (L) 8.9 - 10.3 mg/dL   GFR calc non Af Amer >60 >60 mL/min   GFR calc Af Amer >60 >60 mL/min   Anion gap 6 5 - 15    No results found.  Assessment/Plan: 3 Days Post-Op   Active Problems:   Hip fracture (HCC)  Patient is stable postop. She may be discharged from an orthopedic standpoint to rehabilitation today. She'll remain nonweightbearing on the right hip for 6 weeks postop. She'll remain on Lovenox  30 mg daily for 4 weeks. I will see her back in the office in 2 weeks for follow-up.     Juanell Fairly , MD 05/11/2016, 11:51 AM

## 2016-05-11 NOTE — Progress Notes (Signed)
Report called and given to Driscilla GrammesPat Boiken at [facility]. All questions answered. EMS called for transport.   Harvie HeckMelanie Murrel Freet, RN

## 2016-05-11 NOTE — Progress Notes (Signed)
Patient is medically stable for D/C to [facility] today. Per [name] [facility] liaison patient will go to room 602. RN will call report to Elly ModenaKathy Rogers at [phone] and arrange EMS for transport. Clinical Child psychotherapistocial Worker (CSW) sent D/C orders to [employer] via [employer]. Patient's brother/ HPOA [name] was contacted by CSW and made aware of above. Please reconsult if future social work needs arise. CSW signing off.   [employer]aker Hughes IncorporatedBailey Creed Kail, LCSW [phone]

## 2016-05-15 ENCOUNTER — Ambulatory Visit: Payer: Self-pay | Admitting: Sports Medicine

## 2016-06-26 ENCOUNTER — Ambulatory Visit: Payer: Medicare Other | Admitting: Podiatry

## 2016-07-01 DEATH — deceased

## 2017-09-18 IMAGING — CR DG HIP (WITH PELVIS) OPERATIVE*R*
1 series · 1 of 1 positions shown · non-contrast
Comparison: Preoperative study May 07, 2016

CLINICAL DATA: Open reduction internal fixation for fracture

EXAM:
OPERATIVE RIGHT HIP: 2 VIEWS
FLUOROSCOPY TIME:  2 minutes 24 seconds; 3 acquired images

[1]
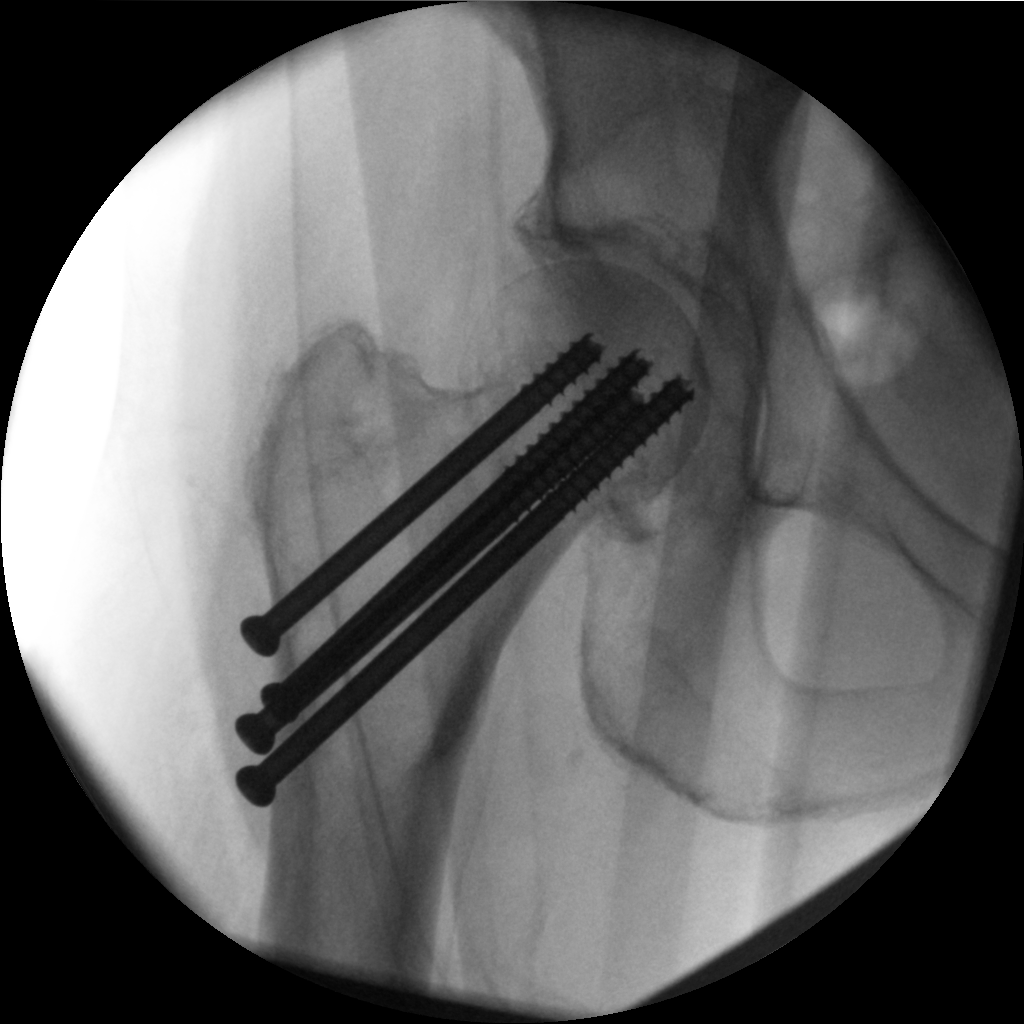

[1 of 1 positions shown; findings below may reference images not displayed]

FINDINGS: Frontal and lateral views were obtained. There are 4 screws
transfixing a proximal femoral neck fracture with alignment
essentially anatomic. The tips of the screws are in the proximal
femoral head region. No dislocation. There is slight narrowing of
the right hip joint.
IMPRESSION: Screw fixation of proximal femoral neck fracture with alignment near
anatomic. No dislocation. Tips of screws in proximal femoral head.

## 2017-09-18 IMAGING — DX DG HIP (WITH OR WITHOUT PELVIS) 1V PORT*R*
2 series · 2 of 2 positions shown · non-contrast
Comparison: Intraoperative radiographs at 8200 hours today.
Preoperative films 05/07/2016.

CLINICAL DATA: 75-year-old female with right hip fracture status
post ORIF. Initial encounter.

EXAM:
DG HIP (WITH OR WITHOUT PELVIS) 1V PORT RIGHT

[hip lat]
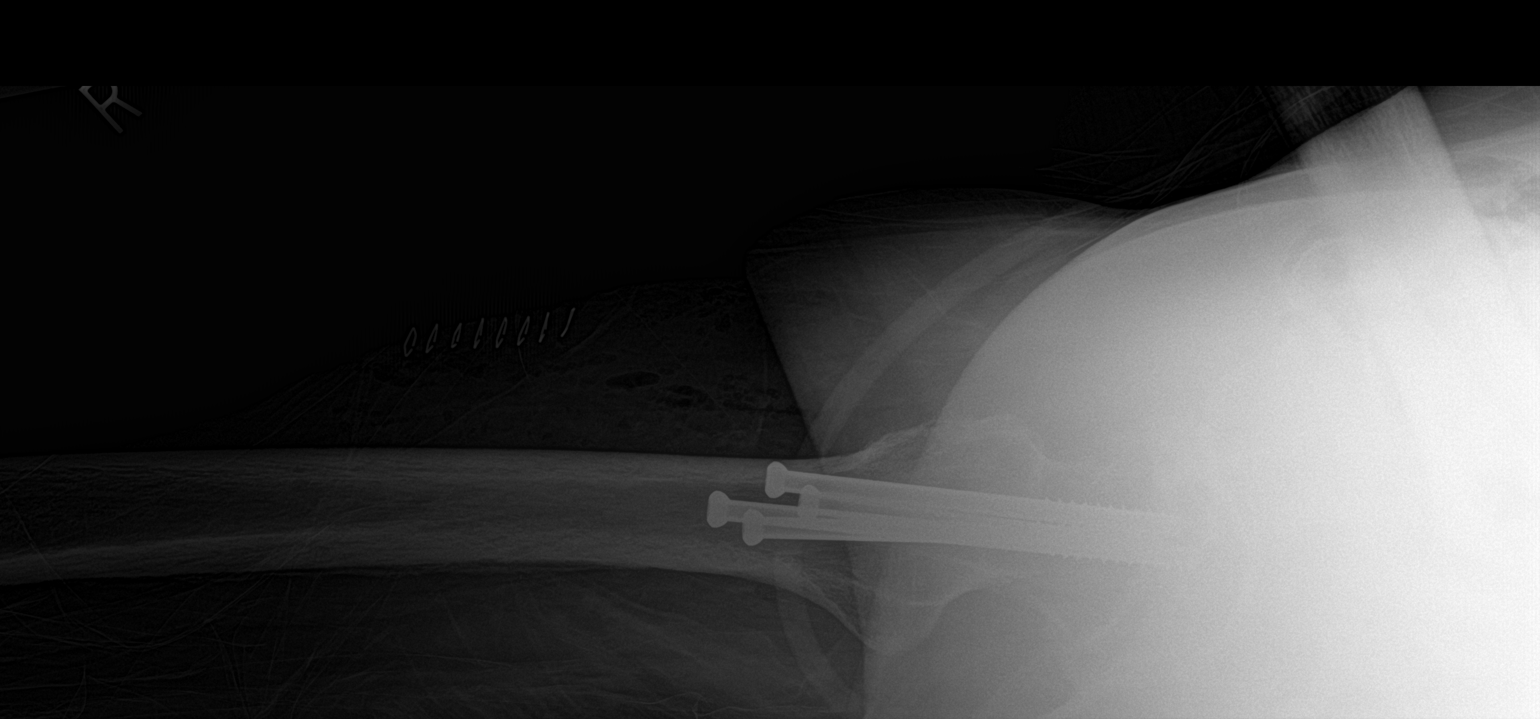

[pelvis ap]
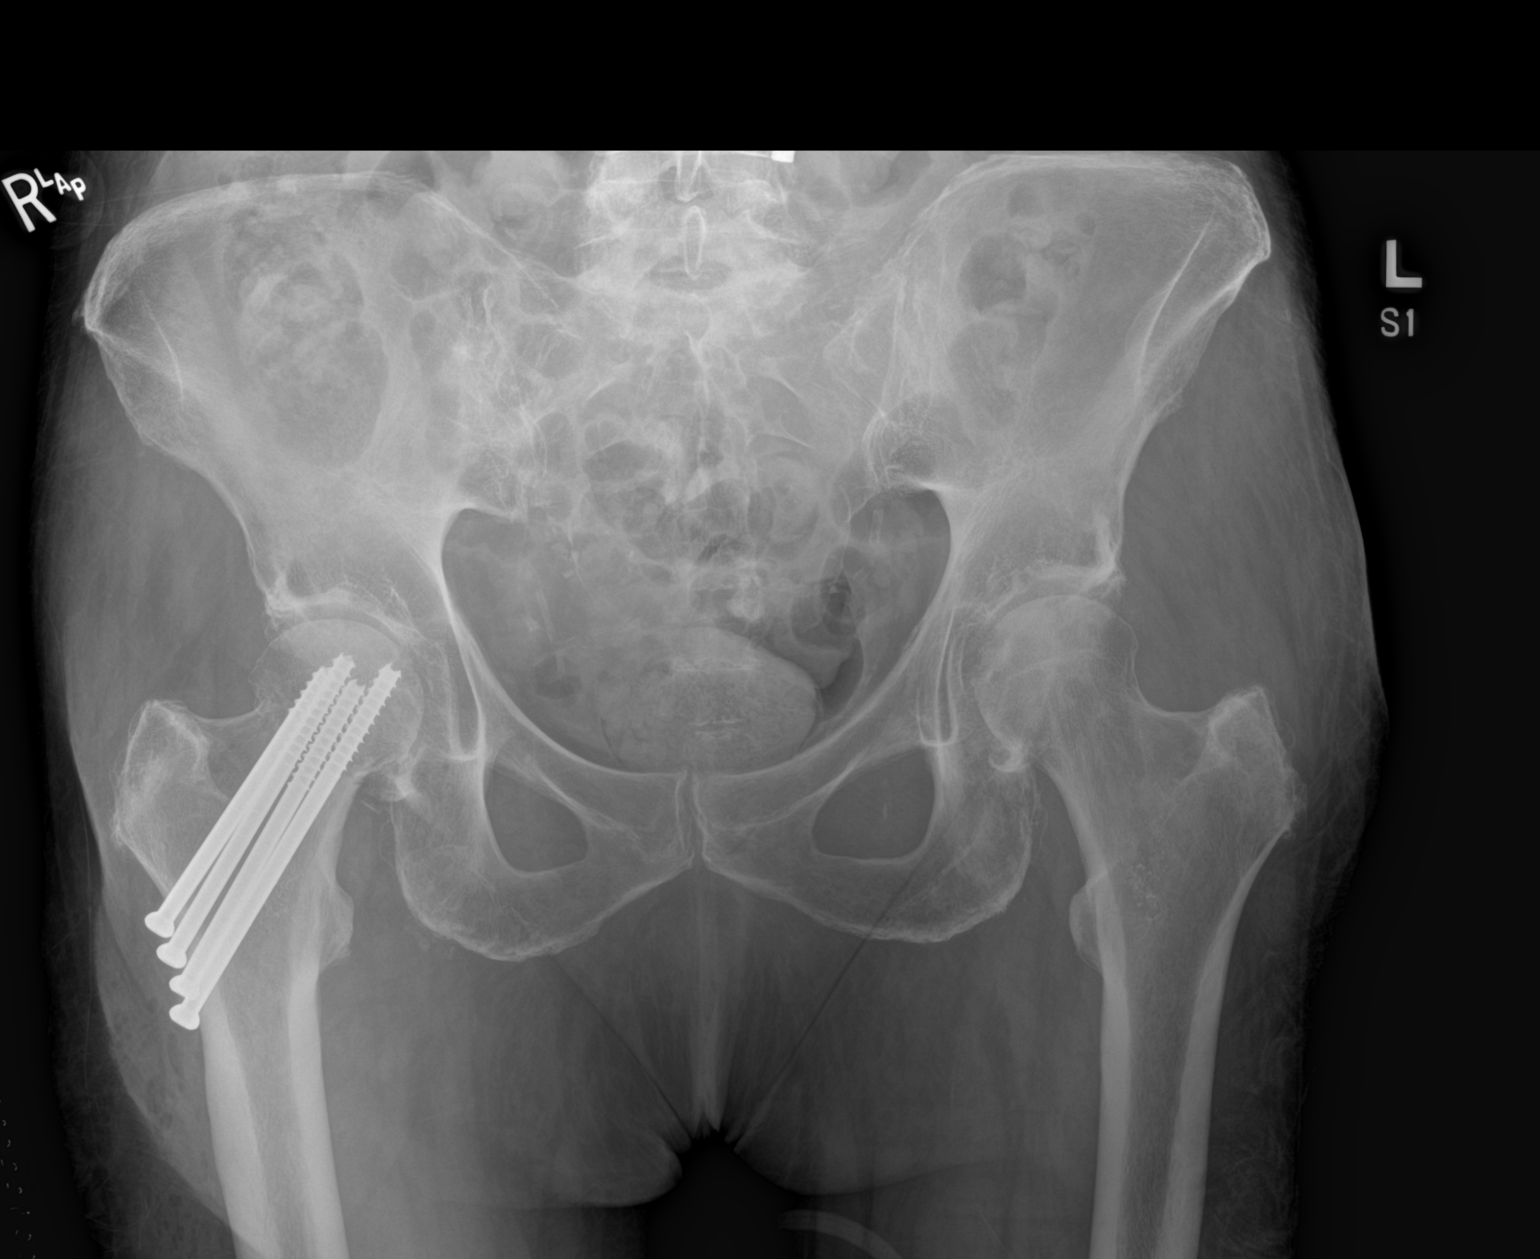

[2 of 2 positions shown; findings below may reference images not displayed]

FINDINGS: Portable supine AP and cross-table lateral views of the right hip
and pelvis. Four cannulated screws have been placed traversing the
right femoral neck fracture. Alignment appears near anatomic. Right
femoral head remains normally located. No new osseous abnormality of
the right femur identified. Pelvis and visible lower lumbar spine
appears stable. Postoperative changes to the right thigh soft
tissues including subcutaneous gas and skin staples.
IMPRESSION: Cannulated screw placement across the right femoral neck fracture
with no adverse features.
